# Patient Record
Sex: Female | Born: 1935 | Race: White | Hispanic: No | Smoking: Former smoker
Health system: Southern US, Community
[De-identification: ages and names within clinical notes are randomized; demographics above are authoritative.]

## PROBLEM LIST (undated history)

## (undated) DIAGNOSIS — E785 Hyperlipidemia, unspecified: Secondary | ICD-10-CM

## (undated) DIAGNOSIS — J449 Chronic obstructive pulmonary disease, unspecified: Secondary | ICD-10-CM

## (undated) DIAGNOSIS — F419 Anxiety disorder, unspecified: Secondary | ICD-10-CM

## (undated) HISTORY — PX: EYE SURGERY: SHX253

## (undated) HISTORY — DX: Hyperlipidemia, unspecified: E78.5

## (undated) HISTORY — PX: OTHER SURGICAL HISTORY: SHX169

## (undated) HISTORY — DX: Anxiety disorder, unspecified: F41.9

---

## 1998-03-24 ENCOUNTER — Other Ambulatory Visit: Admission: RE | Admit: 1998-03-24 | Discharge: 1998-03-24 | Payer: Self-pay

## 1999-08-30 ENCOUNTER — Encounter: Payer: Self-pay | Admitting: Family Medicine

## 1999-08-30 ENCOUNTER — Encounter: Admission: RE | Admit: 1999-08-30 | Discharge: 1999-08-30 | Payer: Self-pay | Admitting: Family Medicine

## 2000-10-08 ENCOUNTER — Encounter: Admission: RE | Admit: 2000-10-08 | Discharge: 2000-10-08 | Payer: Self-pay | Admitting: Family Medicine

## 2000-10-08 ENCOUNTER — Encounter: Payer: Self-pay | Admitting: Family Medicine

## 2000-10-29 ENCOUNTER — Other Ambulatory Visit: Admission: RE | Admit: 2000-10-29 | Discharge: 2000-10-29 | Payer: Self-pay | Admitting: Family Medicine

## 2001-10-24 ENCOUNTER — Encounter: Admission: RE | Admit: 2001-10-24 | Discharge: 2001-10-24 | Payer: Self-pay | Admitting: Family Medicine

## 2001-10-24 ENCOUNTER — Encounter: Payer: Self-pay | Admitting: Family Medicine

## 2002-12-09 ENCOUNTER — Other Ambulatory Visit: Admission: RE | Admit: 2002-12-09 | Discharge: 2002-12-09 | Payer: Self-pay | Admitting: Family Medicine

## 2002-12-10 ENCOUNTER — Encounter: Payer: Self-pay | Admitting: Family Medicine

## 2002-12-10 ENCOUNTER — Encounter: Admission: RE | Admit: 2002-12-10 | Discharge: 2002-12-10 | Payer: Self-pay | Admitting: Family Medicine

## 2003-12-16 ENCOUNTER — Encounter: Admission: RE | Admit: 2003-12-16 | Discharge: 2003-12-16 | Payer: Self-pay | Admitting: Family Medicine

## 2004-03-09 ENCOUNTER — Encounter: Admission: RE | Admit: 2004-03-09 | Discharge: 2004-03-09 | Payer: Self-pay | Admitting: Family Medicine

## 2004-03-28 ENCOUNTER — Ambulatory Visit (HOSPITAL_COMMUNITY): Admission: RE | Admit: 2004-03-28 | Discharge: 2004-03-28 | Payer: Self-pay | Admitting: Gastroenterology

## 2005-06-18 ENCOUNTER — Encounter: Admission: RE | Admit: 2005-06-18 | Discharge: 2005-06-18 | Payer: Self-pay | Admitting: Family Medicine

## 2006-01-15 ENCOUNTER — Ambulatory Visit (HOSPITAL_COMMUNITY): Admission: RE | Admit: 2006-01-15 | Discharge: 2006-01-15 | Payer: Self-pay | Admitting: Cardiovascular Disease

## 2006-07-08 ENCOUNTER — Encounter: Admission: RE | Admit: 2006-07-08 | Discharge: 2006-07-08 | Payer: Self-pay | Admitting: Family Medicine

## 2007-03-06 ENCOUNTER — Emergency Department: Payer: Self-pay | Admitting: Emergency Medicine

## 2007-07-13 ENCOUNTER — Emergency Department (HOSPITAL_COMMUNITY): Admission: EM | Admit: 2007-07-13 | Discharge: 2007-07-13 | Payer: Self-pay | Admitting: Emergency Medicine

## 2007-08-05 ENCOUNTER — Ambulatory Visit (HOSPITAL_COMMUNITY): Admission: RE | Admit: 2007-08-05 | Discharge: 2007-08-05 | Payer: Self-pay | Admitting: Cardiovascular Disease

## 2007-08-11 ENCOUNTER — Encounter: Admission: RE | Admit: 2007-08-11 | Discharge: 2007-08-11 | Payer: Self-pay | Admitting: Family Medicine

## 2008-05-23 ENCOUNTER — Emergency Department (HOSPITAL_COMMUNITY): Admission: EM | Admit: 2008-05-23 | Discharge: 2008-05-23 | Payer: Self-pay | Admitting: Emergency Medicine

## 2008-08-18 ENCOUNTER — Encounter: Admission: RE | Admit: 2008-08-18 | Discharge: 2008-08-18 | Payer: Self-pay | Admitting: Family Medicine

## 2009-05-29 IMAGING — CR DG CHEST 2V
2 series · 2 of 2 positions shown · non-contrast
Comparison: none

CLINICAL DATA: Epigastric pain and chest pain.
 CHEST - 2 VIEW:

[w chest pa]
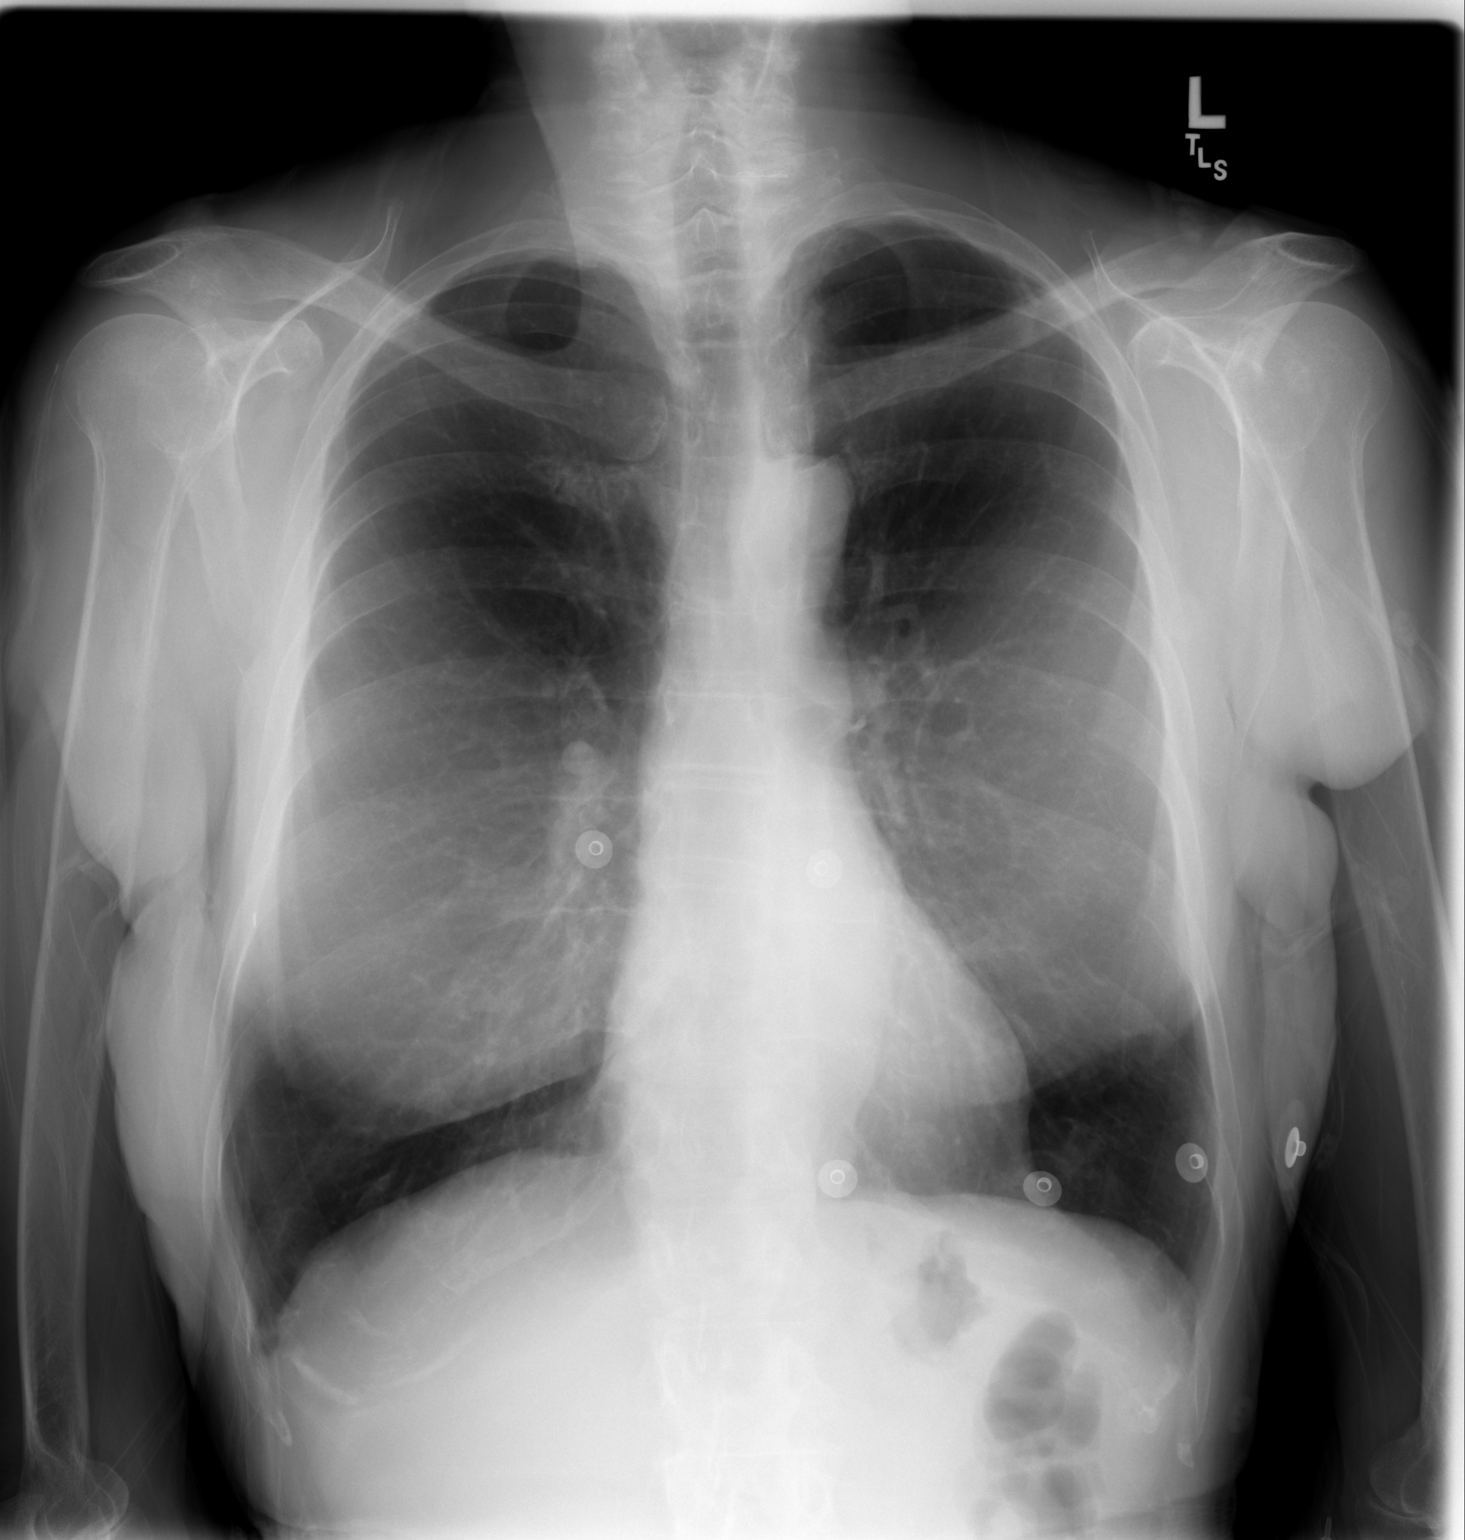

[w chest lat]
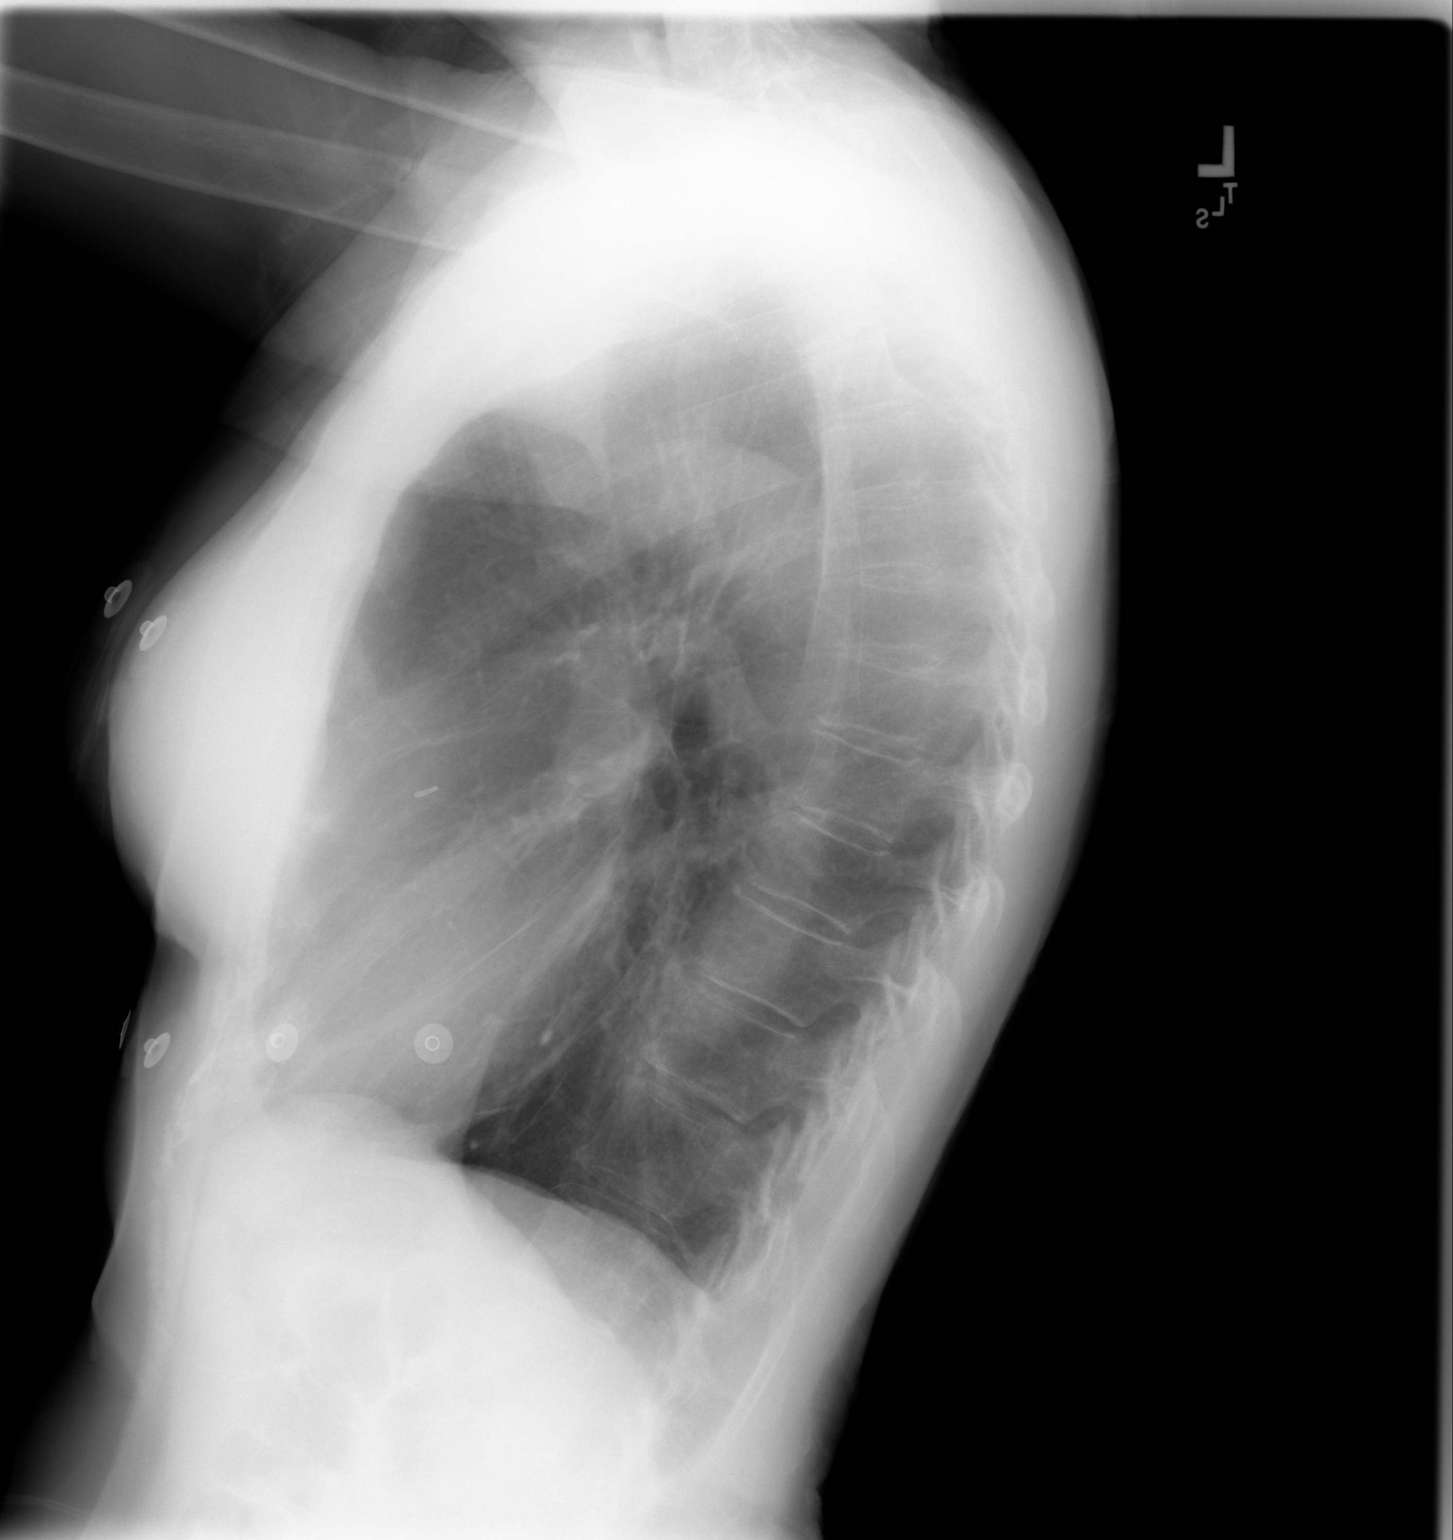

[2 of 2 positions shown; findings below may reference images not displayed]

FINDINGS: Heart size and vascularity are normal and the lungs are clear.  No significant bony abnormality.
IMPRESSION: Normal chest.

## 2009-09-21 ENCOUNTER — Encounter: Admission: RE | Admit: 2009-09-21 | Discharge: 2009-09-21 | Payer: Self-pay | Admitting: Geriatric Medicine

## 2009-09-29 ENCOUNTER — Encounter: Admission: RE | Admit: 2009-09-29 | Discharge: 2009-09-29 | Payer: Self-pay | Admitting: Geriatric Medicine

## 2009-10-10 ENCOUNTER — Other Ambulatory Visit: Admission: RE | Admit: 2009-10-10 | Discharge: 2009-10-10 | Payer: Self-pay | Admitting: Geriatric Medicine

## 2009-11-08 ENCOUNTER — Encounter: Admission: RE | Admit: 2009-11-08 | Discharge: 2009-11-08 | Payer: Self-pay | Admitting: Geriatric Medicine

## 2009-12-13 ENCOUNTER — Observation Stay (HOSPITAL_COMMUNITY): Admission: EM | Admit: 2009-12-13 | Discharge: 2009-12-15 | Payer: Self-pay | Admitting: Emergency Medicine

## 2010-10-03 ENCOUNTER — Encounter
Admission: RE | Admit: 2010-10-03 | Discharge: 2010-10-03 | Payer: Self-pay | Source: Home / Self Care | Attending: Geriatric Medicine | Admitting: Geriatric Medicine

## 2010-10-08 ENCOUNTER — Encounter: Payer: Self-pay | Admitting: Family Medicine

## 2010-10-08 ENCOUNTER — Encounter: Payer: Self-pay | Admitting: Cardiovascular Disease

## 2010-10-08 ENCOUNTER — Encounter: Payer: Self-pay | Admitting: Geriatric Medicine

## 2010-12-10 LAB — POCT I-STAT, CHEM 8
BUN: 11 mg/dL (ref 6–23)
Creatinine, Ser: 0.8 mg/dL (ref 0.4–1.2)
Potassium: 3.9 mEq/L (ref 3.5–5.1)
Sodium: 139 mEq/L (ref 135–145)

## 2010-12-10 LAB — URINALYSIS, ROUTINE W REFLEX MICROSCOPIC
Glucose, UA: NEGATIVE mg/dL
Ketones, ur: 15 mg/dL — AB
Nitrite: NEGATIVE
Specific Gravity, Urine: 1.012 (ref 1.005–1.030)
pH: 7 (ref 5.0–8.0)

## 2010-12-10 LAB — URINE MICROSCOPIC-ADD ON

## 2010-12-10 LAB — CBC
Hemoglobin: 14.3 g/dL (ref 12.0–15.0)
RBC: 4.16 MIL/uL (ref 3.87–5.11)

## 2010-12-10 LAB — URINE CULTURE

## 2010-12-10 LAB — DIFFERENTIAL
Lymphocytes Relative: 15 % (ref 12–46)
Lymphs Abs: 1.2 10*3/uL (ref 0.7–4.0)
Monocytes Absolute: 0.4 10*3/uL (ref 0.1–1.0)
Monocytes Relative: 5 % (ref 3–12)
Neutro Abs: 6.1 10*3/uL (ref 1.7–7.7)

## 2010-12-10 LAB — POCT CARDIAC MARKERS
CKMB, poc: 3 ng/mL (ref 1.0–8.0)
Myoglobin, poc: 70.3 ng/mL (ref 12–200)
Myoglobin, poc: 89.8 ng/mL (ref 12–200)

## 2011-01-11 ENCOUNTER — Other Ambulatory Visit: Payer: Self-pay | Admitting: Dermatology

## 2011-02-02 NOTE — Op Note (Signed)
NAME:  Kimberly Werner, Kimberly Werner                          ACCOUNT NO.:  [id number]   MEDICAL RECORD NO.:  [id number]                   PATIENT TYPE:  AMB   LOCATION:  ENDO                                 FACILITY:  [facility]   PHYSICIAN:  Graylin Shiver, M.D.                DATE OF BIRTH:  Oct 14, 1928   DATE OF PROCEDURE:  03/28/2004  DATE OF DISCHARGE:                                 OPERATIVE REPORT   PROCEDURE:  Colonoscopy.   INDICATIONS:  Screening.   Informed consent was obtained after explanation of the risks of bleeding,  infection, and perforation.   PREMEDICATION:  Fentanyl 50 mcg IV, Versed 5 mg IV.   PROCEDURE:  With the patient in the left lateral decubitus position, a  rectal exam was performed.  No masses were felt.  The Olympus colonoscope  was inserted into the rectum and advanced around the colon to the cecum.  Cecal landmarks were identified.  The cecum and ascending colon were normal.  The transverse colon normal.  The descending colon, sigmoid, and rectum were  normal.  She tolerated the procedure well without complications.   IMPRESSION:  Normal colonoscopy to the cecum.                                               Graylin Shiver, M.D.    Germain Osgood  D:  03/28/2004  T:  03/28/2004  Job:  [id number]   cc:   Burnell Blanks, M.D.

## 2011-05-30 ENCOUNTER — Other Ambulatory Visit: Payer: Self-pay | Admitting: Geriatric Medicine

## 2011-05-30 ENCOUNTER — Other Ambulatory Visit (HOSPITAL_COMMUNITY)
Admission: RE | Admit: 2011-05-30 | Discharge: 2011-05-30 | Disposition: A | Discharge status: Home / Self Care | Payer: PRIVATE HEALTH INSURANCE | Source: Ambulatory Visit | Attending: Geriatric Medicine | Admitting: Geriatric Medicine

## 2011-05-30 DIAGNOSIS — Z1159 Encounter for screening for other viral diseases: Secondary | ICD-10-CM | POA: Insufficient documentation

## 2011-05-30 DIAGNOSIS — Z124 Encounter for screening for malignant neoplasm of cervix: Secondary | ICD-10-CM | POA: Insufficient documentation

## 2011-06-20 LAB — DIFFERENTIAL
Basophils Absolute: 0
Basophils Relative: 1
Eosinophils Absolute: 0.1
Neutro Abs: 5
Neutrophils Relative %: 66

## 2011-06-20 LAB — COMPREHENSIVE METABOLIC PANEL
ALT: 23
Alkaline Phosphatase: 40
BUN: 9
CO2: 30
GFR calc non Af Amer: >60
Glucose, Bld: 101 — ABNORMAL HIGH
Potassium: 5
Total Bilirubin: 0.6
Total Protein: 7.1

## 2011-06-20 LAB — URINE MICROSCOPIC-ADD ON

## 2011-06-20 LAB — CBC
HCT: 42.7
Hemoglobin: 14.5
RBC: 4.4
RDW: 12.4

## 2011-06-20 LAB — URINALYSIS, ROUTINE W REFLEX MICROSCOPIC
Glucose, UA: NEGATIVE
Hgb urine dipstick: NEGATIVE
Ketones, ur: NEGATIVE
Protein, ur: NEGATIVE
pH: 7.5

## 2011-06-27 LAB — COMPREHENSIVE METABOLIC PANEL
ALT: 24
AST: 28
Albumin: 4.2
CO2: 26
Calcium: 10.4
Chloride: 103
Creatinine, Ser: 0.63
GFR calc Af Amer: >60
GFR calc non Af Amer: >60
Sodium: 135
Total Bilirubin: 1.1

## 2011-06-27 LAB — CBC
MCV: 94.4
Platelets: 324
RBC: 4.26
WBC: 6.6

## 2011-06-27 LAB — URINALYSIS, ROUTINE W REFLEX MICROSCOPIC
Nitrite: NEGATIVE
Protein, ur: NEGATIVE
Specific Gravity, Urine: 1.006
Urobilinogen, UA: 0.2

## 2011-06-27 LAB — POCT CARDIAC MARKERS
CKMB, poc: 5.8
Troponin i, poc: <0.05

## 2011-06-27 LAB — DIFFERENTIAL
Eosinophils Absolute: 0
Eosinophils Relative: 1
Lymphocytes Relative: 26
Lymphs Abs: 1.7
Monocytes Absolute: 0.4

## 2011-10-03 ENCOUNTER — Other Ambulatory Visit: Payer: Self-pay | Admitting: Geriatric Medicine

## 2013-01-01 ENCOUNTER — Institutional Professional Consult (permissible substitution): Payer: PRIVATE HEALTH INSURANCE | Admitting: Internal Medicine

## 2013-01-08 ENCOUNTER — Other Ambulatory Visit (HOSPITAL_COMMUNITY): Payer: Self-pay | Admitting: Geriatric Medicine

## 2013-01-08 ENCOUNTER — Ambulatory Visit (HOSPITAL_COMMUNITY)
Admission: RE | Admit: 2013-01-08 | Discharge: 2013-01-08 | Disposition: A | Discharge status: Home / Self Care | Payer: PRIVATE HEALTH INSURANCE | Source: Ambulatory Visit | Attending: Geriatric Medicine | Admitting: Geriatric Medicine

## 2013-01-08 DIAGNOSIS — M25569 Pain in unspecified knee: Secondary | ICD-10-CM

## 2013-01-08 DIAGNOSIS — M79609 Pain in unspecified limb: Secondary | ICD-10-CM

## 2013-01-08 NOTE — Progress Notes (Signed)
Bilateral lower extremity venous duplex:  No evidence of DVT, superficial thrombosis, or Baker's Cyst.   

## 2013-04-06 ENCOUNTER — Ambulatory Visit: Payer: PRIVATE HEALTH INSURANCE | Admitting: Cardiovascular Disease

## 2013-04-09 ENCOUNTER — Encounter: Payer: Self-pay | Admitting: Cardiovascular Disease

## 2013-04-09 ENCOUNTER — Ambulatory Visit (INDEPENDENT_AMBULATORY_CARE_PROVIDER_SITE_OTHER): Payer: PRIVATE HEALTH INSURANCE | Admitting: Cardiovascular Disease

## 2013-04-09 VITALS — BP 142/80 | HR 62 | Ht 63.5 in | Wt 115.4 lb

## 2013-04-09 DIAGNOSIS — E785 Hyperlipidemia, unspecified: Secondary | ICD-10-CM | POA: Insufficient documentation

## 2013-04-09 DIAGNOSIS — R002 Palpitations: Secondary | ICD-10-CM

## 2013-04-09 NOTE — Patient Instructions (Addendum)
Your physician recommends that you schedule a follow-up appointment in: As Needed    

## 2013-04-09 NOTE — Progress Notes (Signed)
   04/09/2013 Kimberly Werner   [birth year]  [id number]  Primary Physician Ginette Otto, MD Primary Cardiologist: Runell Gess MD Roseanne Reno   HPI:  Kimberly Werner Lean is an 79 year old divorced Caucasian female mother to come in her mother, grandmother accompanied by one of her daughters today. Apparently I had seen her remotely. Her primary care physician is Dr. Londell Moh came. Her only risk factor lutes hyperlipidemia but she statin intolerant. There is no family history of heart disease. She's never had a heart attack or stroke, denies chest pain or shortness of breath. +2-3 weeks ago she had a "hot feeling" in the morning after waking up accompanied by heart pounding for 10 minutes nausea and vomiting. This never happened prior to or since the index event.   Current Outpatient Prescriptions  Medication Sig Dispense Refill  . ALPRAZolam (XANAX) 0.5 MG tablet Take 0.25 mg by mouth at bedtime as needed.      . Cholecalciferol (VITAMIN D3) 1000 UNITS CAPS Take 1 capsule by mouth daily.      . cyclobenzaprine (FLEXERIL) 10 MG tablet Take 10 mg by mouth as needed for muscle spasms.      . Ibuprofen (ADVIL PO) Take 1 tablet by mouth as needed.      . loratadine (CLARITIN) 10 MG tablet Take 10 mg by mouth daily.      . Multiple Vitamin (MULTIVITAMIN WITH MINERALS) TABS Take 1 tablet by mouth daily.      . pantoprazole (PROTONIX) 40 MG tablet Take 1 tablet by mouth daily.       No current facility-administered medications for this visit.    Not on File  History   Social History  . Marital Status: Divorced    Spouse Name: N/A    Number of Children: N/A  . Years of Education: N/A   Occupational History  . Not on file.   Social History Main Topics  . Smoking status: Former Smoker    Quit date: 04/09/1953  . Smokeless tobacco: Not on file  . Alcohol Use: No  . Drug Use: No  . Sexually Active: Not on file   Other Topics Concern  . Not on file   Social History  Narrative  . No narrative on file     Review of Systems: General: negative for chills, fever, night sweats or weight changes.  Cardiovascular: negative for chest pain, dyspnea on exertion, edema, orthopnea, palpitations, paroxysmal nocturnal dyspnea or shortness of breath Dermatological: negative for rash Respiratory: negative for cough or wheezing Urologic: negative for hematuria Abdominal: negative for nausea, vomiting, diarrhea, bright red blood per rectum, melena, or hematemesis Neurologic: negative for visual changes, syncope, or dizziness All other systems reviewed and are otherwise negative except as noted above.    Blood pressure 142/80, pulse 62, height 5' 3.5" (1.613 m), weight 115 lb 6.4 oz (52.345 kg).  General appearance: alert and no distress Neck: no adenopathy, no carotid bruit, no JVD, supple, symmetrical, trachea midline and thyroid not enlarged, symmetric, no tenderness/mass/nodules Lungs: clear to auscultation bilaterally Heart: regular rate and rhythm, S1, S2 normal, no murmur, click, rub or gallop Extremities: extremities normal, atraumatic, no cyanosis or edema  EKG normal right sinus rhythm at 62 without ST or T wave changes  ASSESSMENT AND PLAN:   Palpitations patient had a short episode of heart pounding/palpitations several weeks ago which has not recurred since       Runell Gess MD [facility] 04/09/2013 2:39 PM

## 2013-04-09 NOTE — Assessment & Plan Note (Signed)
patient had a short episode of heart pounding/palpitations several weeks ago which has not recurred since

## 2013-09-19 ENCOUNTER — Encounter (HOSPITAL_COMMUNITY): Payer: Self-pay | Admitting: Emergency Medicine

## 2013-09-30 ENCOUNTER — Encounter: Payer: Self-pay | Admitting: Neurology

## 2013-09-30 NOTE — Telephone Encounter (Signed)
Kimberly Werner states she needs inpatient rehab and discharge summary order for patient, please contact her as soon as possible.

## 2013-09-30 NOTE — Telephone Encounter (Signed)
This encounter was created in error - please disregard.

## 2013-10-05 ENCOUNTER — Ambulatory Visit: Payer: Self-pay | Admitting: Podiatry

## 2013-10-12 ENCOUNTER — Ambulatory Visit (INDEPENDENT_AMBULATORY_CARE_PROVIDER_SITE_OTHER): Payer: Medicare Other | Admitting: Podiatry

## 2013-10-12 ENCOUNTER — Encounter: Payer: Self-pay | Admitting: Podiatry

## 2013-10-12 ENCOUNTER — Ambulatory Visit (INDEPENDENT_AMBULATORY_CARE_PROVIDER_SITE_OTHER): Payer: Medicare Other

## 2013-10-12 VITALS — BP 124/85 | HR 67 | Resp 16 | Ht 65.0 in | Wt 111.0 lb

## 2013-10-12 DIAGNOSIS — M2010 Hallux valgus (acquired), unspecified foot: Secondary | ICD-10-CM

## 2013-10-12 DIAGNOSIS — Q828 Other specified congenital malformations of skin: Secondary | ICD-10-CM

## 2013-10-12 DIAGNOSIS — M79672 Pain in left foot: Secondary | ICD-10-CM

## 2013-10-12 DIAGNOSIS — M79609 Pain in unspecified limb: Secondary | ICD-10-CM

## 2013-10-12 NOTE — Patient Instructions (Signed)

## 2013-10-12 NOTE — Progress Notes (Signed)
   Subjective:    Patient ID: Kimberly Werner, female    DOB: 05/29/1929, 80 y.o.   MRN: [id number]  HPI Comments: This left foot is very sore, very hard to walk. Left foot bunion, ongoing for a while, got real bad the last few months, 1st mpj painful and the tip of the left big toe painful.   Foot Pain      Review of Systems  All other systems reviewed and are negative.       Objective:   Physical Exam: I have reviewed her past medical history medications allergies surgeries and social history. Vital signs are stable she is alert and oriented x3. Pulses are palpable bilateral. Neurologic sensorium is intact bilateral. Deep tendon reflexes are elicitable. Muscle strength is 5 over 5 dorsiflexors plantar flexors inverters everters all intrinsic musculature appears to be intact. Orthopedic evaluation demonstrates rectus right foot with some overlapping hammertoe deformity. Left foot demonstrates severe hallux abductovalgus deformity with hammertoe deformity 2 through 5. She has pain on palpation sub-first metatarsophalangeal joint. Cutaneous evaluation Mr. is supple well hydrated cutis with a reactive hyperkeratosis or porokeratotic lesion to the plantar aspect of the first metatarsophalangeal joint area of the left foot. Radiographic evaluation demonstrates severe osteopenia and severe dislocated first metatarsophalangeal joint with a valgus rotation of the hallux.        Assessment & Plan:  Assessment: Severe hallux abductovalgus deformity left with a reactive porokeratotic lesion sub-first left. Fat-pad atrophy left foot.  Plan: Discussed etiology pathology conservative versus surgical therapies. I debrided the porokeratotic lesion for her today and placed a silicone pad to the hallux as well as a silicone metatarsal pad. I will followup with her as needed

## 2013-10-21 ENCOUNTER — Ambulatory Visit
Admission: RE | Admit: 2013-10-21 | Discharge: 2013-10-21 | Disposition: A | Discharge status: Home / Self Care | Payer: PRIVATE HEALTH INSURANCE | Source: Ambulatory Visit | Attending: Gastroenterology | Admitting: Gastroenterology

## 2013-10-21 ENCOUNTER — Other Ambulatory Visit: Payer: Self-pay | Admitting: Gastroenterology

## 2013-10-21 DIAGNOSIS — R1310 Dysphagia, unspecified: Secondary | ICD-10-CM

## 2014-02-12 ENCOUNTER — Encounter (HOSPITAL_COMMUNITY): Payer: Self-pay | Admitting: Emergency Medicine

## 2014-02-12 ENCOUNTER — Emergency Department (HOSPITAL_COMMUNITY)
Admission: EM | Admit: 2014-02-12 | Discharge: 2014-02-12 | Disposition: A | Discharge status: Home / Self Care | Payer: PRIVATE HEALTH INSURANCE | Attending: Emergency Medicine | Admitting: Emergency Medicine

## 2014-02-12 ENCOUNTER — Emergency Department (HOSPITAL_COMMUNITY): Payer: PRIVATE HEALTH INSURANCE

## 2014-02-12 DIAGNOSIS — J4489 Other specified chronic obstructive pulmonary disease: Secondary | ICD-10-CM | POA: Insufficient documentation

## 2014-02-12 DIAGNOSIS — Y929 Unspecified place or not applicable: Secondary | ICD-10-CM | POA: Insufficient documentation

## 2014-02-12 DIAGNOSIS — Y93K1 Activity, walking an animal: Secondary | ICD-10-CM | POA: Insufficient documentation

## 2014-02-12 DIAGNOSIS — Z862 Personal history of diseases of the blood and blood-forming organs and certain disorders involving the immune mechanism: Secondary | ICD-10-CM | POA: Insufficient documentation

## 2014-02-12 DIAGNOSIS — Z87891 Personal history of nicotine dependence: Secondary | ICD-10-CM | POA: Insufficient documentation

## 2014-02-12 DIAGNOSIS — F411 Generalized anxiety disorder: Secondary | ICD-10-CM | POA: Insufficient documentation

## 2014-02-12 DIAGNOSIS — J449 Chronic obstructive pulmonary disease, unspecified: Secondary | ICD-10-CM | POA: Insufficient documentation

## 2014-02-12 DIAGNOSIS — S42293A Other displaced fracture of upper end of unspecified humerus, initial encounter for closed fracture: Secondary | ICD-10-CM | POA: Insufficient documentation

## 2014-02-12 DIAGNOSIS — S4292XA Fracture of left shoulder girdle, part unspecified, initial encounter for closed fracture: Secondary | ICD-10-CM

## 2014-02-12 DIAGNOSIS — W1809XA Striking against other object with subsequent fall, initial encounter: Secondary | ICD-10-CM | POA: Insufficient documentation

## 2014-02-12 DIAGNOSIS — Z79899 Other long term (current) drug therapy: Secondary | ICD-10-CM | POA: Insufficient documentation

## 2014-02-12 DIAGNOSIS — Z8639 Personal history of other endocrine, nutritional and metabolic disease: Secondary | ICD-10-CM | POA: Insufficient documentation

## 2014-02-12 HISTORY — DX: Chronic obstructive pulmonary disease, unspecified: J44.9

## 2014-02-12 MED ORDER — OXYCODONE-ACETAMINOPHEN 5-325 MG PO TABS
1.0000 | ORAL_TABLET | Freq: Once | ORAL | Status: AC
  Administered 2014-02-12: 1 via ORAL
  Filled 2014-02-12: qty 1

## 2014-02-12 MED ORDER — HYDROCODONE-ACETAMINOPHEN 7.5-325 MG/15ML PO SOLN: 10.0000 mL | Freq: Four times a day (QID) | ORAL | Status: DC | PRN

## 2014-02-12 MED ORDER — MORPHINE SULFATE 4 MG/ML IJ SOLN
4.0000 mg | Freq: Once | INTRAMUSCULAR | Status: AC
  Administered 2014-02-12: 4 mg via INTRAVENOUS
  Filled 2014-02-12: qty 1

## 2014-02-12 MED ORDER — MORPHINE SULFATE 2 MG/ML IJ SOLN
2.0000 mg | INTRAMUSCULAR | Status: AC
  Administered 2014-02-12: 2 mg via INTRAVENOUS
  Filled 2014-02-12: qty 1

## 2014-02-12 MED ORDER — MORPHINE SULFATE 2 MG/ML IJ SOLN
2.0000 mg | Freq: Once | INTRAMUSCULAR | Status: AC
  Administered 2014-02-12: 2 mg via INTRAVENOUS
  Filled 2014-02-12: qty 1

## 2014-02-12 NOTE — ED Notes (Signed)
Pt brought to ED from [facility] by [facility] for c/o chest pain, dizziness and diaphoresis. 3 nitroglycerine SL, 324 mg ASA PO, Maalox and cough syrup given on SNF, by the time EMS got to SNF pt was pain free with WNL VS. BP 104/68, HR 78, O2 sat 98% on RA. GBG 182.

## 2014-02-12 NOTE — Discharge Instructions (Signed)
Humerus Fracture, Treated with Immobilization °The humerus is the large bone in your upper arm. You have a broken (fractured) humerus. These fractures are easily diagnosed with X-rays. °TREATMENT  °Simple fractures which will heal without disability are treated with simple immobilization. Immobilization means you will wear a cast, splint, or sling. You have a fracture which will do well with immobilization. The fracture will heal well simply by being held in a good position until it is stable enough to begin range of motion exercises. Do not take part in activities which would further injure your arm.  °HOME CARE INSTRUCTIONS  °· Put ice on the injured area. °· Put ice in a plastic bag. °· Place a towel between your skin and the bag. °· Leave the ice on for 15-20 minutes, 03-04 times a day. °· If you have a cast: °· Do not scratch the skin under the cast using sharp or pointed objects. °· Check the skin around the cast every day. You may put lotion on any red or sore areas. °· Keep your cast dry and clean. °· If you have a splint: °· Wear the splint as directed. °· Keep your splint dry and clean. °· You may loosen the elastic around the splint if your fingers become numb, tingle, or turn cold or blue. °· If you have a sling: °· Wear the sling as directed. °· Do not put pressure on any part of your cast or splint until it is fully hardened. °· Your cast or splint can be protected during bathing with a plastic bag. Do not lower the cast or splint into water. °· Only take over-the-counter or prescription medicines for pain, discomfort, or fever as directed by your caregiver. °· Do range of motion exercises as instructed by your caregiver. °· Follow up as directed by your caregiver. This is very important in order to avoid permanent injury or disability and chronic pain. °SEEK IMMEDIATE MEDICAL CARE IF:  °· Your skin or nails in the injured arm turn blue or gray. °· Your arm feels cold or numb. °· You develop severe  pain in the injured arm. °· You are having problems with the medicines you were given. °MAKE SURE YOU:  °· Understand these instructions. °· Will watch your condition. °· Will get help right away if you are not doing well or get worse. °Document Released: 12/10/2000 Document Revised: 11/26/2011 Document Reviewed: 10/18/2010 °Exit[employer]® Patient Information ©2014 [employer]. ° °

## 2014-02-12 NOTE — Consult Note (Signed)
Reason for Consult:  Left shoulder pain Referring Physician: Dr. Terance Ice Kimberly Werner is an 80 y.o. female.  HPI:  80 y/o female without significant pmh was walking her dog today when the dog pulled her down.  She fell on her left shoulder.  She denies any LOC.  She denies any pain elsewhere in her body.  She lives alone.  Two daughters are at her bedside.  She s/o aching dull pain in the left shoulder that is moderate when holding still and becomes severe with movement.  She denies numbness, tingling or weakness in the left upper extremity.  Past Medical History  Diagnosis Date  . Hyperlipidemia   . Anxiety   . COPD (chronic obstructive pulmonary disease)     Past Surgical History  Procedure Laterality Date  . Eyes surgery  49yrs ago  . Masectomy  3yrs    double  . Eye surgery      Family History  Problem Relation Age of Onset  . COPD Father   . Cancer Mother     breast    Social History:  reports that she quit smoking about 60 years ago. She does not have any smokeless tobacco history on file. She reports that she does not drink alcohol or use illicit drugs.  Allergies: No Known Allergies  Medications: I have reviewed the patient's current medications.  No results found for this or any previous visit (from the past 48 hour(s)).  Dg Shoulder Left  02/12/2014   CLINICAL DATA:  Fall.  EXAM: LEFT SHOULDER - 2+ VIEW  COMPARISON:  Chest x-ray 02/06/2014 .  FINDINGS: There is a comminuted fracture of the subcapital region of the left humerus. Fracture fragments are slightly displaced. Degenerative changes left shoulder. Diffuse osteopenia.  IMPRESSION: Comminuted subcapital left humeral fracture .   Electronically Signed   By: Maisie Fus  Register   On: 02/12/2014 13:30    ROS  No recent f/c/n/v/wt loss Blood pressure 132/78, pulse 63, temperature 98 F (36.7 C), temperature source Oral, resp. rate 22, height 5\' 5"  (1.651 m), weight 50.803 kg (112 lb), SpO2 96.00%. Physical  Exam Thin elderly woman in nad.  HOH.  Alert and oriented x 4.  Mood and affect normal.  EOMI.  Resp unlabored.  L shoulder with swelling and ecchymosis.  Skin intact.  Sens to LT ant motor function 5/5 in radial, median and ulnar nerve dist.  2+ radial pulse.  No lymphadenoapthy of the L UE.  Assessment/Plan: L proximal humerus fracture - I explained the nature of the injury to the patient and her daughters in detail.  At this point the fracture is not significantly displaced, although it is quite comminuted.  We'll immobilize her in a sling.  I've taught her finger, hand, wrist and elbow ROM exercises.  I'll have her follow up in the office in 1-2 weeks to see Dr. Ranell Patrick or Supple for further management of this injury.  She and her daughters understand this plan and agree.  Toni Arthurs 02/12/2014, 8:08 PM

## 2014-02-12 NOTE — ED Provider Notes (Signed)
CSN: [id number]     Arrival date & time 02/12/14  1224 History   First MD Initiated Contact with Patient 02/12/14 1227     Chief Complaint  Patient presents with  . Fall     (Consider location/radiation/quality/duration/timing/severity/associated sxs/prior Treatment) HPI 80 year old female who was walking her dog when her dog pulled on the leash and she fell to the ground striking her left shoulder. She denies hitting her head or any other injury. Due to the pain she was unable to get up. A neighbor rapidly found her and called EMS. She was transported here via EMS and received fentanyl 100 mics and route. She denies any anticoagulation, head injury, or other injury. She is complaining of pain left shoulder. She denies any numbness or tingling or weakness distal to the injury Past Medical History  Diagnosis Date  . Hyperlipidemia   . Anxiety   . COPD (chronic obstructive pulmonary disease)    Past Surgical History  Procedure Laterality Date  . Eyes surgery  37yrs ago  . Masectomy  53yrs    double  . Eye surgery     Family History  Problem Relation Age of Onset  . COPD Father   . Cancer Mother     breast   History  Substance Use Topics  . Smoking status: Former Smoker    Quit date: 04/09/1953  . Smokeless tobacco: Not on file  . Alcohol Use: No   OB History   Grav Para Term Preterm Abortions TAB SAB Ect Mult Living                 Review of Systems  All other systems reviewed and are negative.     Allergies  Review of patient's allergies indicates no known allergies.  Home Medications   Prior to Admission medications   Medication Sig Start Date End Date Taking? Authorizing Provider  albuterol (PROVENTIL HFA;VENTOLIN HFA) 108 (90 BASE) MCG/ACT inhaler Inhale 1-2 puffs into the lungs every 6 (six) hours as needed for wheezing or shortness of breath.   Yes Historical Provider, MD  ALPRAZolam Prudy Feeler) 0.5 MG tablet Take 0.25 mg by mouth at bedtime as needed for  sleep.  03/04/13  Yes Historical Provider, MD  Cholecalciferol (VITAMIN D3) 1000 UNITS CAPS Take 1,000 Units by mouth daily.    Yes Historical Provider, MD  DULoxetine (CYMBALTA) 60 MG capsule Take 60 mg by mouth daily.   Yes Historical Provider, MD  fluticasone (FLONASE) 50 MCG/ACT nasal spray Place 1 spray into both nostrils daily. 01/28/14  Yes Historical Provider, MD  Ibuprofen (ADVIL PO) Take 1 tablet by mouth daily as needed (pain).    Yes Historical Provider, MD  loratadine (CLARITIN) 10 MG tablet Take 10 mg by mouth daily.   Yes Historical Provider, MD  Multiple Vitamin (MULTIVITAMIN WITH MINERALS) TABS Take 1 tablet by mouth daily.   Yes Historical Provider, MD  pantoprazole (PROTONIX) 40 MG tablet Take 40 mg by mouth daily.  03/13/13  Yes Historical Provider, MD  sucralfate (CARAFATE) 1 G tablet Take 1 g by mouth daily. 01/19/14  Yes Historical Provider, MD   BP 166/82  Pulse 68  Temp(Src) 98 F (36.7 C) (Oral)  Resp 20  Ht 5\' 5"  (1.651 m)  Wt 112 lb (50.803 kg)  BMI 18.64 kg/m2  SpO2 97% Physical Exam  Nursing note and vitals reviewed. Constitutional: She is oriented to person, place, and time. She appears well-developed and well-nourished.  HENT:  Head: Normocephalic and atraumatic.  Right Ear: External ear normal.  Left Ear: External ear normal.  Nose: Nose normal.  Mouth/Throat: Oropharynx is clear and moist.  Eyes: Conjunctivae and EOM are normal. Pupils are equal, round, and reactive to light.  Neck: Normal range of motion. Neck supple. No tracheal deviation present.  No posterior cervical spine tenderness to palpation  Cardiovascular: Normal rate, regular rhythm, normal heart sounds and intact distal pulses.   Pulmonary/Chest: Effort normal and breath sounds normal.  Abdominal: Soft. Bowel sounds are normal.  Musculoskeletal:       Left shoulder: She exhibits decreased range of motion, tenderness, bony tenderness, swelling, deformity and pain. She exhibits no effusion,  no crepitus, no laceration, no spasm, normal pulse and normal strength.       Arms: No signs of trauma over pelvis, hips, knees, or ankles. Full active range of motion of bilateral hips and knees.  Neurological: She is oriented to person, place, and time. She displays normal reflexes. No cranial nerve deficit. Coordination normal.  Skin: Skin is warm and dry.  Psychiatric: She has a normal mood and affect. Her behavior is normal. Judgment and thought content normal.    ED Course  Procedures (including critical care time) Labs Review Labs Reviewed - No data to display  Imaging Review No results found.   EKG Interpretation None      MDM   Final diagnoses:  Shoulder fracture, left    IV in place received morphine 2 mg IV for pain. We are awaiting radiograph  Discussed with DR. Hewett and requested ct of shoulder and will see here to discuss with patient and family.  Patient signed out to Dr. Manus Gunningancour pending Dr. Earlie CountsHewett seeing and evaluating patient    Hilario Quarryanielle S Keeven Matty, MD 02/15/14 [phone]

## 2014-02-12 NOTE — ED Notes (Signed)
EMS - Pt was walking her dog when the dog "went crazy" and she lost her footing when she fell down on her left shoulder.  Pt left shoulder has visible deformity with bruising.  Pt has strong pulses in the left arm.  Pt given of Fentanyl then of Fentanyl.  Pt fall was witnessed with no LOC.

## 2014-02-16 ENCOUNTER — Emergency Department (HOSPITAL_COMMUNITY)
Admission: EM | Admit: 2014-02-16 | Discharge: 2014-02-16 | Disposition: A | Discharge status: Home / Self Care | Payer: Medicare Other | Attending: Emergency Medicine | Admitting: Emergency Medicine

## 2014-02-16 ENCOUNTER — Encounter (HOSPITAL_COMMUNITY): Payer: Self-pay | Admitting: Emergency Medicine

## 2014-02-16 ENCOUNTER — Emergency Department (HOSPITAL_COMMUNITY): Payer: Medicare Other

## 2014-02-16 DIAGNOSIS — S5290XD Unspecified fracture of unspecified forearm, subsequent encounter for closed fracture with routine healing: Secondary | ICD-10-CM | POA: Insufficient documentation

## 2014-02-16 DIAGNOSIS — S42209A Unspecified fracture of upper end of unspecified humerus, initial encounter for closed fracture: Secondary | ICD-10-CM

## 2014-02-16 DIAGNOSIS — Z79899 Other long term (current) drug therapy: Secondary | ICD-10-CM | POA: Insufficient documentation

## 2014-02-16 DIAGNOSIS — F411 Generalized anxiety disorder: Secondary | ICD-10-CM | POA: Insufficient documentation

## 2014-02-16 DIAGNOSIS — J449 Chronic obstructive pulmonary disease, unspecified: Secondary | ICD-10-CM | POA: Insufficient documentation

## 2014-02-16 DIAGNOSIS — E785 Hyperlipidemia, unspecified: Secondary | ICD-10-CM | POA: Insufficient documentation

## 2014-02-16 DIAGNOSIS — J4489 Other specified chronic obstructive pulmonary disease: Secondary | ICD-10-CM | POA: Insufficient documentation

## 2014-02-16 DIAGNOSIS — Z87891 Personal history of nicotine dependence: Secondary | ICD-10-CM | POA: Insufficient documentation

## 2014-02-16 MED ORDER — OXYCODONE-ACETAMINOPHEN 5-325 MG PO TABS
2.0000 | ORAL_TABLET | Freq: Once | ORAL | Status: AC
  Administered 2014-02-16: 2 via ORAL
  Filled 2014-02-16: qty 2

## 2014-02-16 NOTE — ED Notes (Signed)
Patient transported to X-ray 

## 2014-02-16 NOTE — ED Notes (Addendum)
EMS - Coming from home with c/o of significant swelling in the left hand from an injury sustained on Friday.  Given 4mg  Zofran and Fentanyl with EMS.

## 2014-02-16 NOTE — ED Notes (Signed)
Patient is alert and orientedx4.  Patient was explained discharge instructions and they understood them with no questions.  The patient's daughter, Hedy Jacob, is taking the patient home.

## 2014-02-16 NOTE — ED Provider Notes (Signed)
Patient presented to the ER with left arm pain and swelling. Patient fell approx a week ago and was diagnosed with prox humerus fracture. She has noticed increased swelling and bruising around the area of the hand and wrist. She does not have any significant pain in the area, however. Family is concerned that there might be an injury. She did not have x-rays of the hand or wrist with the original fall. Face to face Exam: HEENT - PERRLA Lungs - CTAB Heart - RRR, no M/R/G Abd - S/NT/ND Neuro - alert, oriented x3 Musculoskeletal - tenderness proximal left humerus with decreased range of motion. Mild edema of the entire arm to the dorsal aspect of hand. Slight ecchymosis over dorsal aspect of hand. No point tenderness. Patient has normal radial pulse. She has normal sensation and range of motion. No concern for compartment syndrome.  Plan: Patient with normal swelling which has progressed to the hand secondary to gravity after fracture of the proximal humerus. She is under the care of Doctor White Heath. X-ray of wrist and hand obtained, refer back to Doctor Victorino Dike.   Gilda Crease, MD 02/16/14 [phone]

## 2014-02-16 NOTE — ED Provider Notes (Signed)
CSN: [id number]     Arrival date & time 02/16/14  1322 History   First MD Initiated Contact with Patient 02/16/14 1331     Chief Complaint  Patient presents with  . Arm Swelling     (Consider location/radiation/quality/duration/timing/severity/associated sxs/prior Treatment) HPI Patient presents to the emergency department with left arm swelling following a fall that occurred last week.  The patient was seen here in the emergency department with a comminuted humeral neck fracture.  She did have surgery this Friday.  The patient's family noted that she had increased bruising and swelling to her arm.  They have not seen her arm until earlier today outside of the sling.  The patient denies chest pain, shortness of breath, numbness, weakness, dizziness, or syncope.  The patient has been taking her prescribed medications Past Medical History  Diagnosis Date  . Hyperlipidemia   . Anxiety   . COPD (chronic obstructive pulmonary disease)    Past Surgical History  Procedure Laterality Date  . Eyes surgery  2250yrs ago  . Masectomy  4050yrs    double  . Eye surgery     Family History  Problem Relation Age of Onset  . COPD Father   . Cancer Mother     breast   History  Substance Use Topics  . Smoking status: Former Smoker    Quit date: 04/09/1953  . Smokeless tobacco: Not on file  . Alcohol Use: No   OB History   Grav Para Term Preterm Abortions TAB SAB Ect Mult Living                 Review of Systems All other systems negative except as documented in the HPI. All pertinent positives and negatives as reviewed in the HPI.   Allergies  Review of patient's allergies indicates no known allergies.  Home Medications   Prior to Admission medications   Medication Sig Start Date End Date Taking? Authorizing Provider  ALPRAZolam Prudy Feeler(XANAX) 0.5 MG tablet Take 0.25 mg by mouth 2 (two) times daily as needed for anxiety or sleep.  03/04/13  Yes Historical Provider, MD  Cholecalciferol (VITAMIN  D3) 1000 UNITS CAPS Take 1,000 Units by mouth daily.    Yes Historical Provider, MD  DULoxetine (CYMBALTA) 60 MG capsule Take 60 mg by mouth daily.   Yes Historical Provider, MD  HYDROcodone-acetaminophen (HYCET) 7.5-325 mg/15 ml solution Take 10 mLs by mouth 4 (four) times daily as needed for moderate pain. 02/12/14 02/12/15 Yes Glynn OctaveStephen Rancour, MD  Ibuprofen (ADVIL PO) Take 1 tablet by mouth daily as needed (pain).    Yes Historical Provider, MD  loratadine (CLARITIN) 10 MG tablet Take 10 mg by mouth daily.   Yes Historical Provider, MD  Multiple Vitamin (MULTIVITAMIN WITH MINERALS) TABS Take 1 tablet by mouth daily.   Yes Historical Provider, MD  Oxycodone HCl 10 MG TABS Take 10 mg by mouth every 4 (four) hours as needed. pain 02/15/14  Yes Historical Provider, MD  pantoprazole (PROTONIX) 40 MG tablet Take 40 mg by mouth daily.  03/13/13  Yes Historical Provider, MD  sucralfate (CARAFATE) 1 G tablet Take 1 g by mouth daily. 01/19/14  Yes Historical Provider, MD   BP 122/70  Pulse 88  Temp(Src) 99.9 F (37.7 C) (Oral)  Resp 16  SpO2 93% Physical Exam  Nursing note and vitals reviewed. Constitutional: She is oriented to person, place, and time. She appears well-developed and well-nourished. No distress.  HENT:  Head: Normocephalic and atraumatic.  Musculoskeletal:  Patient has swelling noted to her lower arm and hand, bruising  Neurological: She is alert and oriented to person, place, and time.  Skin: Skin is warm and dry. No rash noted. No erythema.    ED Course  Procedures (including critical care time) Labs Review Labs Reviewed - No data to display  Imaging Review No results found.  Patient will be followed up by Dr Tobi Bastos, PA-C 02/16/14 1606

## 2014-02-16 NOTE — Discharge Instructions (Signed)
If he developed increased pain in the forearm and hand, numbness and tingling of the hand or fingers, fingers turning blue or purple, return to the ER.  Humerus Fracture, Treated with Immobilization The humerus is the large bone in your upper arm. You have a broken (fractured) humerus. These fractures are easily diagnosed with X-rays. TREATMENT  Simple fractures which will heal without disability are treated with simple immobilization. Immobilization means you will wear a cast, splint, or sling. You have a fracture which will do well with immobilization. The fracture will heal well simply by being held in a good position until it is stable enough to begin range of motion exercises. Do not take part in activities which would further injure your arm.  HOME CARE INSTRUCTIONS   Put ice on the injured area.  Put ice in a plastic bag.  Place a towel between your skin and the bag.  Leave the ice on for 15-20 minutes, 03-04 times a day.  If you have a cast:  Do not scratch the skin under the cast using sharp or pointed objects.  Check the skin around the cast every day. You may put lotion on any red or sore areas.  Keep your cast dry and clean.  If you have a splint:  Wear the splint as directed.  Keep your splint dry and clean.  You may loosen the elastic around the splint if your fingers become numb, tingle, or turn cold or blue.  If you have a sling:  Wear the sling as directed.  Do not put pressure on any part of your cast or splint until it is fully hardened.  Your cast or splint can be protected during bathing with a plastic bag. Do not lower the cast or splint into water.  Only take over-the-counter or prescription medicines for pain, discomfort, or fever as directed by your caregiver.  Do range of motion exercises as instructed by your caregiver.  Follow up as directed by your caregiver. This is very important in order to avoid permanent injury or disability and chronic  pain. SEEK IMMEDIATE MEDICAL CARE IF:   Your skin or nails in the injured arm turn blue or gray.  Your arm feels cold or numb.  You develop severe pain in the injured arm.  You are having problems with the medicines you were given. MAKE SURE YOU:   Understand these instructions.  Will watch your condition.  Will get help right away if you are not doing well or get worse. Document Released: 12/10/2000 Document Revised: 11/26/2011 Document Reviewed: 10/18/2010 [facility] Patient Information 2014 [place], [place].

## 2014-02-26 NOTE — ED Provider Notes (Signed)
Medical screening examination/treatment/procedure(s) were conducted as a shared visit with non-physician practitioner(s) and myself.  I personally evaluated the patient during the encounter.  Please see separate associated note for evaluation and plan.    EKG Interpretation None        Gilda Creasehristopher J. Stokely Jeancharles, MD 02/26/14 1715

## 2014-09-22 ENCOUNTER — Emergency Department (HOSPITAL_COMMUNITY): Payer: Medicare Other

## 2014-09-22 ENCOUNTER — Encounter (HOSPITAL_COMMUNITY): Payer: Self-pay | Admitting: Family Medicine

## 2014-09-22 ENCOUNTER — Observation Stay (HOSPITAL_COMMUNITY)
Admission: EM | Admit: 2014-09-22 | Discharge: 2014-09-23 | Disposition: A | Discharge status: Home / Self Care | Payer: Medicare Other | Attending: Internal Medicine | Admitting: Internal Medicine

## 2014-09-22 DIAGNOSIS — E785 Hyperlipidemia, unspecified: Secondary | ICD-10-CM | POA: Diagnosis not present

## 2014-09-22 DIAGNOSIS — J449 Chronic obstructive pulmonary disease, unspecified: Secondary | ICD-10-CM | POA: Diagnosis not present

## 2014-09-22 DIAGNOSIS — R0789 Other chest pain: Secondary | ICD-10-CM

## 2014-09-22 DIAGNOSIS — Z79899 Other long term (current) drug therapy: Secondary | ICD-10-CM | POA: Insufficient documentation

## 2014-09-22 DIAGNOSIS — M25512 Pain in left shoulder: Secondary | ICD-10-CM | POA: Insufficient documentation

## 2014-09-22 DIAGNOSIS — F419 Anxiety disorder, unspecified: Secondary | ICD-10-CM | POA: Insufficient documentation

## 2014-09-22 DIAGNOSIS — Z87891 Personal history of nicotine dependence: Secondary | ICD-10-CM | POA: Insufficient documentation

## 2014-09-22 DIAGNOSIS — R479 Unspecified speech disturbances: Secondary | ICD-10-CM | POA: Insufficient documentation

## 2014-09-22 DIAGNOSIS — R002 Palpitations: Principal | ICD-10-CM | POA: Insufficient documentation

## 2014-09-22 DIAGNOSIS — R471 Dysarthria and anarthria: Secondary | ICD-10-CM | POA: Insufficient documentation

## 2014-09-22 DIAGNOSIS — R079 Chest pain, unspecified: Secondary | ICD-10-CM

## 2014-09-22 DIAGNOSIS — E440 Moderate protein-calorie malnutrition: Secondary | ICD-10-CM | POA: Insufficient documentation

## 2014-09-22 LAB — BASIC METABOLIC PANEL
Anion gap: 4 — ABNORMAL LOW (ref 5–15)
BUN: 11 mg/dL (ref 6–23)
CHLORIDE: 106 meq/L (ref 96–112)
CO2: 29 mmol/L (ref 19–32)
Calcium: 9.9 mg/dL (ref 8.4–10.5)
Creatinine, Ser: 0.7 mg/dL (ref 0.50–1.10)
GFR, EST NON AFRICAN AMERICAN: 78 mL/min — AB (ref >90)
Glucose, Bld: 106 mg/dL — ABNORMAL HIGH (ref 70–99)
Potassium: 4 mmol/L (ref 3.5–5.1)
Sodium: 139 mmol/L (ref 135–145)

## 2014-09-22 LAB — URINALYSIS, ROUTINE W REFLEX MICROSCOPIC
BILIRUBIN URINE: NEGATIVE
GLUCOSE, UA: NEGATIVE mg/dL
Hgb urine dipstick: NEGATIVE
KETONES UR: NEGATIVE mg/dL
NITRITE: NEGATIVE
Protein, ur: NEGATIVE mg/dL
SPECIFIC GRAVITY, URINE: 1.012 (ref 1.005–1.030)
Urobilinogen, UA: 0.2 mg/dL (ref 0.0–1.0)
pH: 7 (ref 5.0–8.0)

## 2014-09-22 LAB — URINE MICROSCOPIC-ADD ON

## 2014-09-22 LAB — I-STAT TROPONIN, ED
Troponin i, poc: 0 ng/mL (ref 0.00–0.08)
Troponin i, poc: 0 ng/mL (ref 0.00–0.08)

## 2014-09-22 LAB — CBC
HEMATOCRIT: 41 % (ref 36.0–46.0)
Hemoglobin: 13.9 g/dL (ref 12.0–15.0)
MCH: 31.7 pg (ref 26.0–34.0)
MCHC: 33.9 g/dL (ref 30.0–36.0)
MCV: 93.4 fL (ref 78.0–100.0)
Platelets: 286 10*3/uL (ref 150–400)
RBC: 4.39 MIL/uL (ref 3.87–5.11)
RDW: 13 % (ref 11.5–15.5)
WBC: 5.9 10*3/uL (ref 4.0–10.5)

## 2014-09-22 LAB — TROPONIN I: Troponin I: <0.03 ng/mL (ref <0.031)

## 2014-09-22 LAB — BRAIN NATRIURETIC PEPTIDE: B Natriuretic Peptide: 71.9 pg/mL (ref 0.0–100.0)

## 2014-09-22 MED ORDER — ACETAMINOPHEN 325 MG PO TABS
650.0000 mg | ORAL_TABLET | ORAL | Status: DC | PRN
  Administered 2014-09-23: 650 mg via ORAL
  Filled 2014-09-22: qty 2

## 2014-09-22 MED ORDER — PANTOPRAZOLE SODIUM 40 MG PO TBEC
40.0000 mg | DELAYED_RELEASE_TABLET | Freq: Every day | ORAL | Status: DC
  Administered 2014-09-23: 40 mg via ORAL
  Filled 2014-09-22: qty 1

## 2014-09-22 MED ORDER — ENSURE COMPLETE PO LIQD
237.0000 mL | Freq: Two times a day (BID) | ORAL | Status: DC
  Administered 2014-09-23 (×2): 237 mL via ORAL

## 2014-09-22 MED ORDER — SUCRALFATE 1 G PO TABS
1.0000 g | ORAL_TABLET | Freq: Every day | ORAL | Status: DC | PRN
  Filled 2014-09-22: qty 1

## 2014-09-22 MED ORDER — CALCIUM CARBONATE-VITAMIN D 500-200 MG-UNIT PO TABS
1.0000 | ORAL_TABLET | Freq: Every day | ORAL | Status: DC
  Administered 2014-09-23: 1 via ORAL
  Filled 2014-09-22 (×2): qty 1

## 2014-09-22 MED ORDER — ASPIRIN EC 325 MG PO TBEC
325.0000 mg | DELAYED_RELEASE_TABLET | Freq: Every day | ORAL | Status: DC
  Administered 2014-09-22 – 2014-09-23 (×2): 325 mg via ORAL
  Filled 2014-09-22 (×2): qty 1

## 2014-09-22 MED ORDER — ONDANSETRON HCL 4 MG/2ML IJ SOLN: 4.0000 mg | Freq: Four times a day (QID) | INTRAMUSCULAR | Status: DC | PRN

## 2014-09-22 MED ORDER — GI COCKTAIL ~~LOC~~
30.0000 mL | Freq: Four times a day (QID) | ORAL | Status: DC | PRN
  Filled 2014-09-22: qty 30

## 2014-09-22 MED ORDER — HEPARIN SODIUM (PORCINE) 5000 UNIT/ML IJ SOLN
5000.0000 [IU] | Freq: Three times a day (TID) | INTRAMUSCULAR | Status: DC
  Administered 2014-09-22 – 2014-09-23 (×3): 5000 [IU] via SUBCUTANEOUS
  Filled 2014-09-22 (×5): qty 1

## 2014-09-22 MED ORDER — LORATADINE 10 MG PO TABS
10.0000 mg | ORAL_TABLET | Freq: Every day | ORAL | Status: DC
  Administered 2014-09-23: 10 mg via ORAL
  Filled 2014-09-22: qty 1

## 2014-09-22 MED ORDER — PNEUMOCOCCAL VAC POLYVALENT 25 MCG/0.5ML IJ INJ
0.5000 mL | INJECTION | INTRAMUSCULAR | Status: DC
  Filled 2014-09-22: qty 0.5

## 2014-09-22 MED ORDER — DULOXETINE HCL 60 MG PO CPEP
60.0000 mg | ORAL_CAPSULE | Freq: Every day | ORAL | Status: DC
  Administered 2014-09-23: 60 mg via ORAL
  Filled 2014-09-22: qty 1

## 2014-09-22 MED ORDER — NAPHAZOLINE-PHENIRAMINE 0.025-0.3 % OP SOLN
1.0000 [drp] | Freq: Every day | OPHTHALMIC | Status: DC
  Administered 2014-09-23: 1 [drp] via OPHTHALMIC
  Filled 2014-09-22: qty 15

## 2014-09-22 MED ORDER — CETYLPYRIDINIUM CHLORIDE 0.05 % MT LIQD
7.0000 mL | Freq: Two times a day (BID) | OROMUCOSAL | Status: DC
  Administered 2014-09-23: 7 mL via OROMUCOSAL

## 2014-09-22 MED ORDER — MORPHINE SULFATE 2 MG/ML IJ SOLN: 2.0000 mg | INTRAMUSCULAR | Status: DC | PRN

## 2014-09-22 MED ORDER — CALCIUM CARBONATE-VITAMIN D 500-125 MG-UNIT PO TABS: ORAL_TABLET | Freq: Every day | ORAL | Status: DC

## 2014-09-22 MED ORDER — SODIUM CHLORIDE 0.9 % IV SOLN
INTRAVENOUS | Status: DC
  Administered 2014-09-22: 20:00:00 via INTRAVENOUS

## 2014-09-22 MED ORDER — ZOLPIDEM TARTRATE 5 MG PO TABS: 5.0000 mg | ORAL_TABLET | Freq: Every evening | ORAL | Status: DC | PRN

## 2014-09-22 MED ORDER — ALPRAZOLAM 0.25 MG PO TABS
0.0625 mg | ORAL_TABLET | Freq: Two times a day (BID) | ORAL | Status: DC
  Administered 2014-09-22: 0.125 mg via ORAL
  Filled 2014-09-22: qty 1

## 2014-09-22 NOTE — ED Notes (Signed)
PT monitored by pulse ox, bp cuff, and 5-lead. 

## 2014-09-22 NOTE — ED Notes (Signed)
Meal tray ordered 

## 2014-09-22 NOTE — H&P (Signed)
[facility] History and Physical  Kimberly Werner ZOX:[id number] DOB: [birth year] DOA: 09/22/2014  Referring physician: Alvira Monday, MD PCP: Ginette Otto, MD   Chief Complaint: Chest tightness  HPI: Kimberly Werner is a 81 y.o. female presents with chest tightness. She states that he has been having palpitation and irregular heart beat. She noted that she was also having rapid heart beat. She also states that she had a funny feeling in her chest. She states that it was feeling tight. She states that she was not light headed and had no dizziness. She did not feel that she was going to pass out. She states that she felt a little weird. She has no prior history of heart disease. Not a smoker.  In December she had an episode where she was having a lot of difficulty speaking. Patient was speaking gibberish according to herr daughter. She has not had this occur since that time. She is on multiple medications including OTC phenylephrine and also excedrin migraine and visine eye drops   Review of Systems:  Constitutional:  No weight loss, night sweats, Fevers, chills, fatigue.  HEENT:  No headaches, Difficulty swallowing,Tooth/dental problems,Sore throat,  No sneezing Cardio-vascular:  No chest pain, ++chest tightness Orthopnea, no swelling in lower extremities, anasarca, dizziness, ++palpitations  GI:  No heartburn, indigestion, abdominal pain, nausea, vomiting, diarrhea Resp:  No shortness of breath with exertion or at rest. No excess mucus, no productive cough, No non-productive cough, No coughing up of blood.No change in color of mucus Skin:  no rash or lesions GU:  no dysuria, change in color of urine, no urgency or frequency Musculoskeletal:  No joint pain or swelling. No decreased range of motion Psych:  No change in mood or affect. No depression or anxiety  Past Medical History  Diagnosis Date  . Hyperlipidemia   . Anxiety   . COPD (chronic obstructive pulmonary  disease)    Past Surgical History  Procedure Laterality Date  . Eyes surgery  19yrs ago  . Masectomy  80yrs    double  . Eye surgery     Social History:  reports that she quit smoking about 61 years ago. She does not have any smokeless tobacco history on file. She reports that she does not drink alcohol or use illicit drugs.  No Known Allergies  Family History  Problem Relation Age of Onset  . COPD Father   . Cancer Mother     breast     Prior to Admission medications   Medication Sig Start Date End Date Taking? Authorizing Provider  ALPRAZolam Prudy Feeler) 0.5 MG tablet Take 0.0625-0.125 mg by mouth 2 (two) times daily. 1/8 to 1/4 tablet 03/04/13  Yes Historical Provider, MD  aspirin-acetaminophen-caffeine (EXCEDRIN MIGRAINE) [phone] MG per tablet Take 1 tablet by mouth daily as needed for headache or migraine.   Yes Historical Provider, MD  Calcium Carbonate-Vitamin D (CALCIUM 500 + D PO) Take 1 tablet by mouth daily.   Yes Historical Provider, MD  DULoxetine (CYMBALTA) 60 MG capsule Take 60 mg by mouth daily.   Yes Historical Provider, MD  ibuprofen (ADVIL,MOTRIN) 200 MG tablet Take 200 mg by mouth every 6 (six) hours as needed (pain).   Yes Historical Provider, MD  loratadine (CLARITIN) 10 MG tablet Take 10 mg by mouth daily.   Yes Historical Provider, MD  Multiple Vitamin (MULTIVITAMIN WITH MINERALS) TABS Take 1 tablet by mouth daily.   Yes Historical Provider, MD  naphazoline-pheniramine (VISINE-A) 0.025-0.3 % ophthalmic solution Place  1 drop into both eyes daily.   Yes Historical Provider, MD  pantoprazole (PROTONIX) 40 MG tablet Take 40 mg by mouth daily.  03/13/13  Yes Historical Provider, MD  phenylephrine (SUDAFED PE) 10 MG TABS tablet Take 10 mg by mouth daily as needed (congestion).   Yes Historical Provider, MD  sucralfate (CARAFATE) 1 G tablet Take 1 g by mouth daily as needed (GERD).  01/19/14  Yes Historical Provider, MD  HYDROcodone-acetaminophen (HYCET) 7.5-325 mg/15 ml  solution Take 10 mLs by mouth 4 (four) times daily as needed for moderate pain. Patient not taking: Reported on 09/22/2014 02/12/14 02/12/15  Glynn Octave, MD   Physical Exam: Filed Vitals:   09/22/14 1630 09/22/14 1700 09/22/14 1730 09/22/14 1800  BP: 143/85 138/102 156/76 144/88  Pulse: 61 66 79 65  Resp: SpO2: 97% 95% 97% 96%    Wt Readings from Last 3 Encounters:  02/12/14 50.803 kg (112 lb)  10/12/13 50.349 kg (111 lb)  04/09/13 52.345 kg (115 lb 6.4 oz)    General:  Appears calm and comfortable Eyes: PERRL, normal lids, irises & conjunctiva ENT: grossly normal hearing, lips & tongue Neck: no LAD, masses or thyromegaly Cardiovascular: RRR, no m/r/g. No LE edema. Respiratory: CTA bilaterally, no w/r/r. Normal respiratory effort. Abdomen: soft, ntnd Skin: no rash or induration seen on limited exam Musculoskeletal: grossly normal tone BUE/BLE Psychiatric: grossly normal mood and affect, speech fluent and appropriate Neurologic: grossly non-focal.          Labs on Admission:  Basic Metabolic Panel:  Recent Labs Lab 09/22/14 1524  NA 139  K 4.0  CL 106  CO2 29  GLUCOSE 106*  BUN 11  CREATININE 0.70  CALCIUM 9.9   Liver Function Tests: No results for input(s): AST, ALT, ALKPHOS, BILITOT, PROT, ALBUMIN in the last 168 hours. No results for input(s): LIPASE, AMYLASE in the last 168 hours. No results for input(s): AMMONIA in the last 168 hours. CBC:  Recent Labs Lab 09/22/14 1524  WBC 5.9  HGB 13.9  HCT 41.0  MCV 93.4  PLT 286   Cardiac Enzymes: No results for input(s): CKTOTAL, CKMB, CKMBINDEX, TROPONINI in the last 168 hours.  BNP (last 3 results) No results for input(s): PROBNP in the last 8760 hours. CBG: No results for input(s): GLUCAP in the last 168 hours.  Radiological Exams on Admission: Dg Chest 2 View  09/22/2014   CLINICAL DATA:  Shortness of breath for 3 weeks which has worsened today. Chest tightness. Initial encounter.   EXAM: CHEST  2 VIEW  COMPARISON:  PA and lateral chest 12/13/2009 and 02/06/2014.  FINDINGS: The chest is hyperexpanded with attenuation of the pulmonary vasculature but the lungs are clear. Heart size is normal. No pneumothorax pleural effusion. Surgical clip projecting over the periphery of the right breast is noted.  IMPRESSION: The lungs appear emphysematous without acute disease.   Electronically Signed   By: Drusilla Kanner M.D.   On: 09/22/2014 16:00      Assessment/Plan Principal Problem:   Chest tightness or pressure Active Problems:   Hyperlipidemia   Palpitations   Obstructive chronic bronchitis without exacerbation   Dysarthria   Chest tightness   1. Chest tightness -will admit for observation -will check serial enzymes -will get echo in am -she is also on multiple stimulant medications which may be contributing to her symptoms and I have discussed this with her  2. Hyperlipidemia -will continue with home medications  3. Palpitations -  possibly related to stimulant medications -will monitor on telemetry for arrhythmias  4. COPD -currently stable -will monitor  5. Dysarthria possible TIA -this occurred several weeks ago -will check a carotid doppler -will get an echo -check TSH and A1C     Code Status: Full Code(must indicate code status--if unknown or must be presumed, indicate so) DVT Prophylaxis:Heparin Family Communication: Daughters (indicate person spoken with, if applicable, with phone number if by telephone) Disposition Plan: home (indicate anticipated LOS)  Time spent: 60min  [facility]AN,SAADAT A [facility] Pager [phone]

## 2014-09-22 NOTE — ED Notes (Signed)
Pt here for left shoulder pain and the feeling of her heart being irregular. Denies SOB.

## 2014-09-22 NOTE — ED Notes (Signed)
MD Gentry at bedside.

## 2014-09-22 NOTE — ED Notes (Signed)
PA Mercedes at bedside.  

## 2014-09-22 NOTE — ED Provider Notes (Signed)
CSN: [id number]     Arrival date & time 09/22/14  1507 History   First MD Initiated Contact with Patient 09/22/14 1527     Chief Complaint  Patient presents with  . Shoulder Pain  . Irregular Heart Beat     (Consider location/radiation/quality/duration/timing/severity/associated sxs/prior Treatment) HPI Comments: Kimberly Werner is a 81 y.o. female with a PMHx of HLD, anxiety, and COPD, who presents to the ED with complaints of palpitations and left shoulder pain. Patient states that she has had an irregular heartbeat in the past, and that earlier this morning it was beating irregularly although not fast, and states that she had some discomfort which she can only describe as a "funny feeling" but not pain. She states that her left shoulder had a "steady" 5/10 pain which she cannot further describe, but has had some improvement since this morning, with no known aggravating or alleviating factors. She states that she has broken the shoulder in the past, but that the pain from the shoulder fracture had resolved entirely, and that this does not feel similar to the pain she had had with the shoulder injury. She cannot describe it any further and has difficulty explaining where exactly the pain is. She has not tried any medications for her symptoms. She does also report that her right leg was swollen 4 days ago, but that it has now improved, stating that from the knee down to the ankle there was swelling which she is unsure of why it began, but it is gone down entirely. She denies any erythema or pain in this leg at the time of the swelling. She also states that a few weeks ago, she had an episode of "speaking gibberish" at which time she saw her doctor, who told her to follow-up in one week but she did not. He resolved entirely on its own, and she had no further workup done at that time. She has seen a cardiologist for her regular heartbeat once before in 2014, and had nothing done at that time. She is not on  any medications for her heart. She denies any fevers, chills, shortness of breath, chest pain, hemoptysis, diaphoresis, orthopnea, PND, abdominal pain, nausea, vomiting, diarrhea, constipation, dysuria, hematuria, numbness, tingling, or weakness.   Patient is a 81 y.o. female presenting with shoulder pain. The history is provided by the patient. No language interpreter was used.  Shoulder Pain Location:  Shoulder Time since incident:  10 hours Injury: no   Shoulder location:  L shoulder Pain details:    Quality:  Unable to specify ("steady")   Radiates to:  Does not radiate   Severity:  Mild (5/10)   Onset quality:  Gradual   Duration:  10 hours   Timing:  Constant   Progression:  Improving Chronicity:  New Prior injury to area:  Yes Relieved by:  None tried Worsened by:  Nothing tried Ineffective treatments:  None tried Associated symptoms: no back pain, no decreased range of motion, no fever, no muscle weakness, no neck pain, no numbness, no stiffness, no swelling and no tingling     Past Medical History  Diagnosis Date  . Hyperlipidemia   . Anxiety   . COPD (chronic obstructive pulmonary disease)    Past Surgical History  Procedure Laterality Date  . Eyes surgery  6383yrs ago  . Masectomy  [age]yrs    double  . Eye surgery     Family History  Problem Relation Age of Onset  . COPD Father   .  Cancer Mother     breast   History  Substance Use Topics  . Smoking status: Former Smoker    Quit date: 04/09/1953  . Smokeless tobacco: Not on file  . Alcohol Use: No   OB History    No data available     Review of Systems  Constitutional: Negative for fever and chills.  Eyes: Negative for visual disturbance.  Respiratory: Negative for cough and shortness of breath.   Cardiovascular: Positive for palpitations and leg swelling (RLE swelling 4 days ago, now resolved). Negative for chest pain.  Gastrointestinal: Negative for nausea, vomiting, abdominal pain, diarrhea,  constipation and blood in stool.  Genitourinary: Negative for dysuria and hematuria.  Musculoskeletal: Positive for arthralgias (L shoulder). Negative for myalgias, back pain, joint swelling, stiffness and neck pain.  Skin: Negative for color change.  Neurological: Positive for speech difficulty (several weeks ago, none today). Negative for dizziness, weakness, light-headedness, numbness and headaches.  Psychiatric/Behavioral: Negative for confusion.   10 Systems reviewed and are negative for acute change except as noted in the HPI.    Allergies  Review of patient's allergies indicates no known allergies.  Home Medications   Prior to Admission medications   Medication Sig Start Date End Date Taking? Authorizing Provider  ALPRAZolam Prudy Feeler) 0.5 MG tablet Take 0.25 mg by mouth 2 (two) times daily as needed for anxiety or sleep.  03/04/13   Historical Provider, MD  Cholecalciferol (VITAMIN D3) 1000 UNITS CAPS Take 1,000 Units by mouth daily.     Historical Provider, MD  DULoxetine (CYMBALTA) 60 MG capsule Take 60 mg by mouth daily.    Historical Provider, MD  HYDROcodone-acetaminophen (HYCET) 7.5-325 mg/15 ml solution Take 10 mLs by mouth 4 (four) times daily as needed for moderate pain. 02/12/14 02/12/15  Glynn Octave, MD  Ibuprofen (ADVIL PO) Take 1 tablet by mouth daily as needed (pain).     Historical Provider, MD  loratadine (CLARITIN) 10 MG tablet Take 10 mg by mouth daily.    Historical Provider, MD  Multiple Vitamin (MULTIVITAMIN WITH MINERALS) TABS Take 1 tablet by mouth daily.    Historical Provider, MD  Oxycodone HCl 10 MG TABS Take 10 mg by mouth every 4 (four) hours as needed. pain 02/15/14   Historical Provider, MD  pantoprazole (PROTONIX) 40 MG tablet Take 40 mg by mouth daily.  03/13/13   Historical Provider, MD  sucralfate (CARAFATE) 1 G tablet Take 1 g by mouth daily. 01/19/14   Historical Provider, MD   BP 147/74 mmHg  Pulse 63  Resp 17  SpO2 100% Physical Exam   Constitutional: She is oriented to person, place, and time. Vital signs are normal. She appears well-developed and well-nourished.  Non-toxic appearance. No distress.  Afebrile, nontoxic, NAD, thin elderly female  HENT:  Head: Normocephalic and atraumatic.  Mouth/Throat: Mucous membranes are normal.  Eyes: Conjunctivae and EOM are normal. Right eye exhibits no discharge. Left eye exhibits no discharge.  Neck: Normal range of motion. Neck supple. JVD present. Carotid bruit is not present.  Mild JVD  Cardiovascular: Normal rate, regular rhythm, normal heart sounds and intact distal pulses.  Exam reveals no gallop and no friction rub.   No murmur heard. RRR, nl s1/s2, no m/r/g, distal pulses intact, no pedal edema  Pulmonary/Chest: Effort normal and breath sounds normal. No respiratory distress. She has no decreased breath sounds. She has no wheezes. She has no rhonchi. She has no rales.  Abdominal: Soft. Normal appearance and bowel sounds are  normal. She exhibits no distension. There is no tenderness. There is no rigidity, no rebound and no guarding.  Musculoskeletal: Normal range of motion.       Left shoulder: She exhibits crepitus. She exhibits normal range of motion, no bony tenderness, no swelling, no effusion, no deformity, normal pulse and normal strength.  L shoulder with baseline ROM intact, no bony TTP, no swelling or effusion, mild crepitus anteriorly without deformity, distal pulses intact, strength 5/5 in all extremities, sensation grossly intact  Neurological: She is alert and oriented to person, place, and time. She has normal strength. No sensory deficit.  Skin: Skin is warm, dry and intact. No rash noted.  Psychiatric: She has a normal mood and affect.  Nursing note and vitals reviewed.   ED Course  Procedures (including critical care time) Labs Review Labs Reviewed  BASIC METABOLIC PANEL - Abnormal; Notable for the following:    Glucose, Bld 106 (*)    GFR calc non Af  Amer 78 (*)    Anion gap 4 (*)    All other components within normal limits  CBC  BRAIN NATRIURETIC PEPTIDE  URINALYSIS, ROUTINE W REFLEX MICROSCOPIC  I-STAT TROPOININ, ED  Rosezena Sensor, ED    Imaging Review Dg Chest 2 View  09/22/2014   CLINICAL DATA:  Shortness of breath for 3 weeks which has worsened today. Chest tightness. Initial encounter.  EXAM: CHEST  2 VIEW  COMPARISON:  PA and lateral chest 12/13/2009 and 02/06/2014.  FINDINGS: The chest is hyperexpanded with attenuation of the pulmonary vasculature but the lungs are clear. Heart size is normal. No pneumothorax pleural effusion. Surgical clip projecting over the periphery of the right breast is noted.  IMPRESSION: The lungs appear emphysematous without acute disease.   Electronically Signed   By: Drusilla Kanner M.D.   On: 09/22/2014 16:00     EKG Interpretation   Date/Time:  Wednesday September 22 2014 16:24:09 EST Ventricular Rate:  60 PR Interval:  154 QRS Duration: 90 QT Interval:  414 QTC Calculation: 414 R Axis:   65 Text Interpretation:  Sinus rhythm No significant change since last  tracing Confirmed by Mirian Mo [phone]) on 09/22/2014 4:28:27 PM      MDM   Final diagnoses:  Chest tightness  Palpitations  Left shoulder pain    81 y.o. female with palpitations and L shoulder discomfort, difficulty with describing her current symptoms. NSR on EKG. Trop neg. CBC WNL, BMP WNL. CXR with emphysematous changes but stable. Overall well appearing. Chart review reveals one prior cardiology visit in 03/2013 for similar complaints. Difficult to determine etiology, but most concerning symptom is the TIA-like symptoms recently. ABCD2 score moderate (3 points although duration of symptoms not clear therefore may be higher). Will discuss case with Dr. Littie Deeds who will see pt.  5:38 PM Dr. Littie Deeds saw pt, who agrees that pt should be observed for ACS rule out and likely MRI for her TIA-like symptoms. Will consult for  admission.  6:16 PM Dr. Hilton Cork returning page, will admit. Please see his dictation for further documentation of care. Pt stable at this time. Also, BNP WNL.  BP 144/88 mmHg  Pulse 65  Resp 17  SpO2 96%    [place], PA-C 09/22/14 1818  Mirian Mo, MD 09/24/14 [phone]

## 2014-09-22 NOTE — ED Notes (Signed)
Hospitalist at bedside 

## 2014-09-23 ENCOUNTER — Observation Stay (HOSPITAL_COMMUNITY): Payer: Medicare Other

## 2014-09-23 DIAGNOSIS — R479 Unspecified speech disturbances: Secondary | ICD-10-CM | POA: Insufficient documentation

## 2014-09-23 DIAGNOSIS — F419 Anxiety disorder, unspecified: Secondary | ICD-10-CM | POA: Diagnosis not present

## 2014-09-23 DIAGNOSIS — I519 Heart disease, unspecified: Secondary | ICD-10-CM

## 2014-09-23 DIAGNOSIS — R490 Dysphonia: Secondary | ICD-10-CM

## 2014-09-23 DIAGNOSIS — M25512 Pain in left shoulder: Secondary | ICD-10-CM | POA: Diagnosis not present

## 2014-09-23 DIAGNOSIS — E785 Hyperlipidemia, unspecified: Secondary | ICD-10-CM | POA: Diagnosis not present

## 2014-09-23 DIAGNOSIS — E440 Moderate protein-calorie malnutrition: Secondary | ICD-10-CM | POA: Insufficient documentation

## 2014-09-23 DIAGNOSIS — R471 Dysarthria and anarthria: Secondary | ICD-10-CM

## 2014-09-23 DIAGNOSIS — R002 Palpitations: Secondary | ICD-10-CM

## 2014-09-23 DIAGNOSIS — R0789 Other chest pain: Secondary | ICD-10-CM

## 2014-09-23 LAB — TSH: TSH: 1.998 u[IU]/mL (ref 0.350–4.500)

## 2014-09-23 LAB — HEMOGLOBIN A1C
HEMOGLOBIN A1C: 5.5 % (ref <5.7)
MEAN PLASMA GLUCOSE: 111 mg/dL (ref <117)

## 2014-09-23 LAB — TROPONIN I

## 2014-09-23 MED ORDER — FLUTICASONE PROPIONATE 50 MCG/ACT NA SUSP: 2.0000 | Freq: Every day | NASAL | Status: DC

## 2014-09-23 MED ORDER — ASPIRIN 325 MG PO TBEC: 325.0000 mg | DELAYED_RELEASE_TABLET | Freq: Every day | ORAL | Status: DC

## 2014-09-23 MED ORDER — ENSURE COMPLETE PO LIQD: 237.0000 mL | Freq: Three times a day (TID) | ORAL | Status: DC

## 2014-09-23 MED ORDER — ACETAMINOPHEN 325 MG PO TABS: 650.0000 mg | ORAL_TABLET | ORAL | Status: DC | PRN

## 2014-09-23 NOTE — Progress Notes (Signed)
INITIAL NUTRITION ASSESSMENT  Pt meets criteria for NON-SEVERE (MODERATE) MALNUTRITION in the context of chronic illness as evidenced by moderate fat mass and moderate to severe muscle mass loss.  DOCUMENTATION CODES Per approved criteria  -Non-severe (moderate) malnutrition in the context of chronic illness   INTERVENTION: Provide Ensure Complete po TID, each supplement provides 350 kcal and 13 grams of protein.  Encourage adequate PO intake.   NUTRITION DIAGNOSIS: Increased nutrient needs related to chronic illness, COPD as evidenced by estimated nutrition needs.   Goal: Pt to meet >/= 90% of their estimated nutrition needs   Monitor:  PO intake, weight trends, labs, I/O's  Reason for Assessment: MST  81 y.o. female  Admitting Dx: Chest tightness or pressure  ASSESSMENT: Pt presents with chest tightness. She states that he has been having palpitation and irregular heart beat.  Pt reports her appetite has been just "ok". Current meal completion has been 50-100%. She reports she has been eating 3 meals a day, however her appetite has been coming and going due to stress. She reports she drinks at least 2 Ensure drinks a day, however noticed she has lost a couple of pounds over the past month. Pt was educated to drink at least 2-3 Ensure drinks a day to aid in caloric and protein needs as well as preventing further weight loss. Pt expressed understanding. Pt was encouraged to eat her food at meals. RD to order Ensure TID.  Nutrition Focused Physical Exam:  Subcutaneous Fat:  Orbital Region: N/A Upper Arm Region: Moderate depletion Thoracic and Lumbar Region: Moderate depletion  Muscle:  Temple Region: N/A Clavicle Bone Region: Severe depletion Clavicle and Acromion Bone Region: Severe depletion Scapular Bone Region: N/A Dorsal Hand: N/A Patellar Region: Severe depletion Anterior Thigh Region: Moderate depletion Posterior Calf Region: Moderate depletion  Edema:  none  Labs and medications reviewed.  Height: Ht Readings from Last 1 Encounters:  09/22/14 5\' 5"  (1.651 m)    Weight: Wt Readings from Last 1 Encounters:  09/22/14 112 lb 3.2 oz (50.894 kg)    Ideal Body Weight: 125 lbs  % Ideal Body Weight: 90%  Wt Readings from Last 10 Encounters:  09/22/14 112 lb 3.2 oz (50.894 kg)  02/12/14 112 lb (50.803 kg)  10/12/13 111 lb (50.349 kg)  04/09/13 115 lb 6.4 oz (52.345 kg)    Usual Body Weight: 112 lbs  % Usual Body Weight: 100%  BMI:  Body mass index is 18.67 kg/(m^2).  Estimated Nutritional Needs: Kcal: 1650-1850 Protein: 70-85 grams Fluid: 1.65 - 1.85 L/day  Skin: Intact  Diet Order: Diet Heart  EDUCATION NEEDS: -Education needs addressed   Intake/Output Summary (Last 24 hours) at 09/23/14 1018 Last data filed at 09/23/14 0900  Gross per 24 hour  Intake    120 ml  Output      0 ml  Net    120 ml    Last BM: 1/6  Labs:   Recent Labs Lab 09/22/14 1524  NA 139  K 4.0  CL 106  CO2 29  BUN 11  CREATININE 0.70  CALCIUM 9.9  GLUCOSE 106*    CBG (last 3)  No results for input(s): GLUCAP in the last 72 hours.  Scheduled Meds: . ALPRAZolam  0.125 mg Oral BID  . antiseptic oral rinse  7 mL Mouth Rinse BID  . aspirin EC  325 mg Oral Daily  . calcium-vitamin D  1 tablet Oral Q breakfast  . DULoxetine  60 mg Oral Daily  .  feeding supplement (ENSURE COMPLETE)  237 mL Oral BID BM  . heparin  5,000 Units Subcutaneous 3 times per day  . loratadine  10 mg Oral Daily  . naphazoline-pheniramine  1 drop Both Eyes Daily  . pantoprazole  40 mg Oral Daily  . pneumococcal 23 valent vaccine  0.5 mL Intramuscular Tomorrow-1000    Continuous Infusions: . sodium chloride 50 mL/hr at 09/22/14 2023    Past Medical History  Diagnosis Date  . Hyperlipidemia   . Anxiety   . COPD (chronic obstructive pulmonary disease)     Past Surgical History  Procedure Laterality Date  . Eyes surgery  36yrs ago  . Masectomy   75yrs    double  . Eye surgery      Marijean Niemann, MS, RD, LDN Pager # [phone] After hours/ weekend pager # [phone]

## 2014-09-23 NOTE — Progress Notes (Signed)
*  PRELIMINARY RESULTS* Echocardiogram 2D Echocardiogram has been performed.  Kimberly Werner, Kimberly Werner 09/23/2014, 12:46 PM

## 2014-09-23 NOTE — Progress Notes (Signed)
UR completed 

## 2014-09-23 NOTE — Progress Notes (Signed)
VASCULAR LAB PRELIMINARY  PRELIMINARY  PRELIMINARY  PRELIMINARY  Carotid Dopplers completed.    Preliminary report:  1-39% ICA stenosis.  Vertebral artery flow is antegrade.   Kiptyn Rafuse, RVT 09/23/2014, 3:38 PM

## 2014-09-23 NOTE — Progress Notes (Signed)
Pt. Got d/c instructions,prescriptions and follow up appoinments.IV was removed,Tele was d/c.Pt. Ready to go home with daughters.

## 2014-09-23 NOTE — Discharge Summary (Signed)
Physician Discharge Summary  Kimberly ChinaHelen M Werner ZOX:[id number] DOB: 09/18/1928 DOA: 09/22/2014  PCP: Ginette OttoSTONEKING,HAL THOMAS, MD  Admit date: 09/22/2014 Discharge date: 09/23/2014  Discharge Diagnoses:  Principal Problem:   Chest tightness or pressure Active Problems:   Hyperlipidemia   Palpitations   Obstructive chronic bronchitis without exacerbation   Dysarthria several weeks prior to admission.  Suspect TIA   Malnutrition of moderate degree   Discharge Condition: stable  Filed Weights   09/22/14 2030  Weight: 50.894 kg (112 lb 3.2 oz)    History of present illness:  81 y.o. female presents with chest tightness. She states that he has been having palpitation and irregular heart beat. She noted that she was also having rapid heart beat. She also states that she had a funny feeling in her chest. She states that it was feeling tight. She states that she was not light headed and had no dizziness. She did not feel that she was going to pass out. She states that she felt a little weird. She has no prior history of heart disease. Not a smoker.  In December she had an episode where she was having a lot of difficulty speaking. Patient was speaking gibberish according to herr daughter. She has not had this occur since that time. She is on multiple medications including OTC phenylephrine and also excedrin migraine and visine eye drops   Hospital Course:  Observed on telemetry.  Had periods of frequent PVCs.  MI ruled out.  Echo with no wall motion abnormalities, normal EF and no source of embolus.  Doppler carotids without significant stenosis. CT brain showed nothing acute. May have had a recent TIA.  Started on ASA and encouraged to stop decongestants.  Recommend outpatient fasting lipid profile  Procedures:  none  Consultations:  none  Discharge Exam: Filed Vitals:   09/23/14 1347  BP: 128/61  Pulse: 63  Temp: 98.4 F (36.9 C)  Resp: 16    General: a and o Cardiovascular:  RRR Respiratory: CTA Neuro: nonfocal. Speech clear and fluent  Discharge Instructions   Discharge Instructions    Activity as tolerated - No restrictions    Complete by:  As directed      Diet general    Complete by:  As directed      Discharge instructions    Complete by:  As directed   May take mucinex, guaifenesin, saline nasal spray as needed for congestion, post nasal drip          Current Discharge Medication List    START taking these medications   Details  acetaminophen (TYLENOL) 325 MG tablet Take 2 tablets (650 mg total) by mouth every 4 (four) hours as needed for headache or mild pain.    aspirin EC 325 MG EC tablet Take 1 tablet (325 mg total) by mouth daily. Qty: 30 tablet, Refills: 0    fluticasone (FLONASE) 50 MCG/ACT nasal spray Place 2 sprays into both nostrils daily. Qty: 16 g, Refills: 0      CONTINUE these medications which have NOT CHANGED   Details  ALPRAZolam (XANAX) 0.5 MG tablet Take 0.0625-0.125 mg by mouth 2 (two) times daily. 1/8 to 1/4 tablet    Calcium Carbonate-Vitamin D (CALCIUM 500 + D PO) Take 1 tablet by mouth daily.    DULoxetine (CYMBALTA) 60 MG capsule Take 60 mg by mouth daily.    ibuprofen (ADVIL,MOTRIN) 200 MG tablet Take 200 mg by mouth every 6 (six) hours as needed (pain).    loratadine (  CLARITIN) 10 MG tablet Take 10 mg by mouth daily.    Multiple Vitamin (MULTIVITAMIN WITH MINERALS) TABS Take 1 tablet by mouth daily.    pantoprazole (PROTONIX) 40 MG tablet Take 40 mg by mouth daily.     sucralfate (CARAFATE) 1 G tablet Take 1 g by mouth daily as needed (GERD).       STOP taking these medications     aspirin-acetaminophen-caffeine (EXCEDRIN MIGRAINE) 250-250-65 MG per tablet      naphazoline-pheniramine (VISINE-A) 0.025-0.3 % ophthalmic solution      phenylephrine (SUDAFED PE) 10 MG TABS tablet      HYDROcodone-acetaminophen (HYCET) 7.5-325 mg/15 ml solution        No Known Allergies Follow-up Information     Follow up with Ginette Otto, MD In 3 weeks.   Specialty:  Internal Medicine   Contact information:   [address] [place] [address] [phone]        The results of significant diagnostics from this hospitalization (including imaging, microbiology, ancillary and laboratory) are listed below for reference.    Significant Diagnostic Studies: Dg Chest 2 View  09/22/2014   CLINICAL DATA:  Shortness of breath for 3 weeks which has worsened today. Chest tightness. Initial encounter.  EXAM: CHEST  2 VIEW  COMPARISON:  PA and lateral chest 12/13/2009 and 02/06/2014.  FINDINGS: The chest is hyperexpanded with attenuation of the pulmonary vasculature but the lungs are clear. Heart size is normal. No pneumothorax pleural effusion. Surgical clip projecting over the periphery of the right breast is noted.  IMPRESSION: The lungs appear emphysematous without acute disease.   Electronically Signed   By: Drusilla Kanner M.D.   On: 09/22/2014 16:00   Ct Head Wo Contrast  09/23/2014   CLINICAL DATA:  Additional evaluation for a single episode of garbled speech December 2015, currently experiencing chest tightness and hypertension  EXAM: CT HEAD WITHOUT CONTRAST  TECHNIQUE: Contiguous axial images were obtained from the base of the skull through the vertex without intravenous contrast.  COMPARISON:  12/13/2009  FINDINGS: No abnormal attenuation to suggest vascular territory infarct. No hemorrhage or extra-axial fluid. No hydrocephalus. Moderate diffuse atrophy and mild low attenuation in the deep white matter. 1 cm calcified nodule in the medial right temporal lobe. This is stable and likely benign possibly of vascular malformation.  No significant inflammatory change in the visualized portions of the sinuses. Calvarium is intact.  IMPRESSION: Chronic involutional change. Chronic calcified lesion right temporal lobe likely either a benign cavernoma or Hamm are trauma.   Electronically  Signed   By: Esperanza Heir M.D.   On: 09/23/2014 16:03   Echo Left ventricle: The cavity size was normal. Wall thickness was normal. Systolic function was normal. The estimated ejection fraction was in the range of 55% to 60%. Although no diagnostic regional wall motion abnormality was identified, this possibility cannot be completely excluded on the basis of this study. Doppler parameters are consistent with abnormal left ventricular relaxation (grade 1 diastolic dysfunction).  Impressions:  - No cardiac source of emboli was indentified.  Carotid doppler Bilateral: mild mixed plaque origin ICA. 1-39% ICA stenosis. Vertebral artery flow is antegrade.  EKG Sinus bradycardia. Rate 58  Microbiology: No results found for this or any previous visit (from the past 240 hour(s)).   Labs: Basic Metabolic Panel:  Recent Labs Lab 09/22/14 1524  NA 139  K 4.0  CL 106  CO2 29  GLUCOSE 106*  BUN 11  CREATININE  0.70  CALCIUM 9.9   Liver Function Tests: No results for input(s): AST, ALT, ALKPHOS, BILITOT, PROT, ALBUMIN in the last 168 hours. No results for input(s): LIPASE, AMYLASE in the last 168 hours. No results for input(s): AMMONIA in the last 168 hours. CBC:  Recent Labs Lab 09/22/14 1524  WBC 5.9  HGB 13.9  HCT 41.0  MCV 93.4  PLT 286   Cardiac Enzymes:  Recent Labs Lab 09/22/14 2056 09/22/14 2253 09/23/14 0243  TROPONINI <0.03 <0.03 <0.03   BNP: BNP (last 3 results) No results for input(s): PROBNP in the last 8760 hours. CBG: No results for input(s): GLUCAP in the last 168 hours.     SignedChristiane Ha  [facility] 09/23/2014, 4:56 PM

## 2014-09-23 NOTE — Progress Notes (Signed)
   09/23/14 1100  Clinical Encounter Type  Visited With Patient;Health care provider  Visit Type Initial;Spiritual support;Social support  Spiritual Encounters  Spiritual Needs Prayer;Emotional;Grief support  Stress Factors  Patient Stress Factors Financial concerns;Loss;Loss of control   Chaplain was referred to patient via spiritual care consult. Patient primarily described herself as worried today. Patient wanted to talk to chaplain about her many worries today. One major worry of the patient has been her daughters. Patient's ex-husband passed away two weeks ago in this hospital and now that she is in this hospital she knows it has been difficult on them. Patient is also worried about getting home. Patient does not feel she needs to be in the hospital and says she is increasingly worried about the financial burden of being in the hospital. Patient's religion is important to her and she has been a part of the [employer] for several decades. Currently, patient is experiencing further grief by her recent decision to leave her church of over fifty years. This has been a painful loss in the patient's life but she knows she had to make the decision to leave because she felt unsupported by the pastor. Patient's daughters have been supportive during this time and she says they don't like leaving her alone even though she says she is fine. Patient asked for prayer and chaplain prayed with patient. Chaplain will continue to provide emotional and spiritual support for patient and patient's family as needed. Calise Dunckel, Tommi EmeryBlake R, Chaplain  11:19 AM

## 2014-11-19 ENCOUNTER — Other Ambulatory Visit: Payer: Self-pay | Admitting: Nurse Practitioner

## 2014-11-19 DIAGNOSIS — M79606 Pain in leg, unspecified: Secondary | ICD-10-CM

## 2014-11-25 ENCOUNTER — Ambulatory Visit
Admission: RE | Admit: 2014-11-25 | Discharge: 2014-11-25 | Disposition: A | Discharge status: Home / Self Care | Payer: Medicare Other | Source: Ambulatory Visit | Attending: Nurse Practitioner | Admitting: Nurse Practitioner

## 2014-11-25 DIAGNOSIS — M79606 Pain in leg, unspecified: Secondary | ICD-10-CM

## 2015-03-01 ENCOUNTER — Ambulatory Visit
Admission: RE | Admit: 2015-03-01 | Discharge: 2015-03-01 | Disposition: A | Discharge status: Home / Self Care | Payer: Medicare Other | Source: Ambulatory Visit | Attending: Physician Assistant | Admitting: Physician Assistant

## 2015-03-01 ENCOUNTER — Other Ambulatory Visit: Payer: Self-pay | Admitting: Physician Assistant

## 2015-03-01 DIAGNOSIS — R140 Abdominal distension (gaseous): Secondary | ICD-10-CM

## 2015-03-24 ENCOUNTER — Emergency Department (HOSPITAL_COMMUNITY)
Admission: EM | Admit: 2015-03-24 | Discharge: 2015-03-24 | Disposition: A | Discharge status: Home / Self Care | Payer: Medicare Other | Attending: Emergency Medicine | Admitting: Emergency Medicine

## 2015-03-24 ENCOUNTER — Encounter (HOSPITAL_COMMUNITY): Payer: Self-pay | Admitting: Emergency Medicine

## 2015-03-24 NOTE — ED Provider Notes (Signed)
Medical Screening Exam initiated at 1440.  Patient awake and alert, HD stable.  No CP.  One episode of brbpr.   EKG Interpretation  Date/Time:  Thursday March 24 2015 14:25:42 EDT Ventricular Rate:  82 PR Interval:  143 QRS Duration: 138 QT Interval:  432 QTC Calculation: 505 R Axis:   -67 Text Interpretation:  Sinus rhythm Nonspecific IVCD with LAD LVH with secondary repolarization abnormality Inferior infarct, old Probable lateral infarct, age indeterminate Anterior infarct, acute (LAD) Sinus rhythm ST-t wave abnormality Left ventricular hypertrophy Abnormal ekg on review, after observation of monitor, artefact may be occluding evidence of pacer activity Reconfirmed by Gerhard MunchLOCKWOOD, Karlei Waldo  MD [id number]) on 03/24/2015 2:38:54 PM       Similar rhythm, pacemaker activity on a cardiac monitor, rate 75, abnormal   Gerhard Munchobert Rokia Bosket, MD 03/24/15 1458

## 2015-03-24 NOTE — ED Notes (Signed)
Per EMS pt had bright red blood coming from rectum. EMS reports approx blood loss. Pt sts she was laying down and felt a lot of warm fluid and blood was pouring out. Pt is A&O pt is on coumadin . Pt has hx of anxiety, pacemaker, stroke with L side paralysis deficit. Pt c/o LUQ and LLQ pain since last night. Pt denies nausea and vomiting.

## 2015-03-25 ENCOUNTER — Other Ambulatory Visit: Payer: Self-pay | Admitting: Gastroenterology

## 2016-01-03 IMAGING — CR DG WRIST COMPLETE 3+V*L*
4 series · 4 of 4 positions shown · non-contrast
Comparison: None.

CLINICAL DATA: Fell.  Left wrist pain.

EXAM:
LEFT WRIST - COMPLETE 3+ VIEW

[view not recorded (1 of 4)]
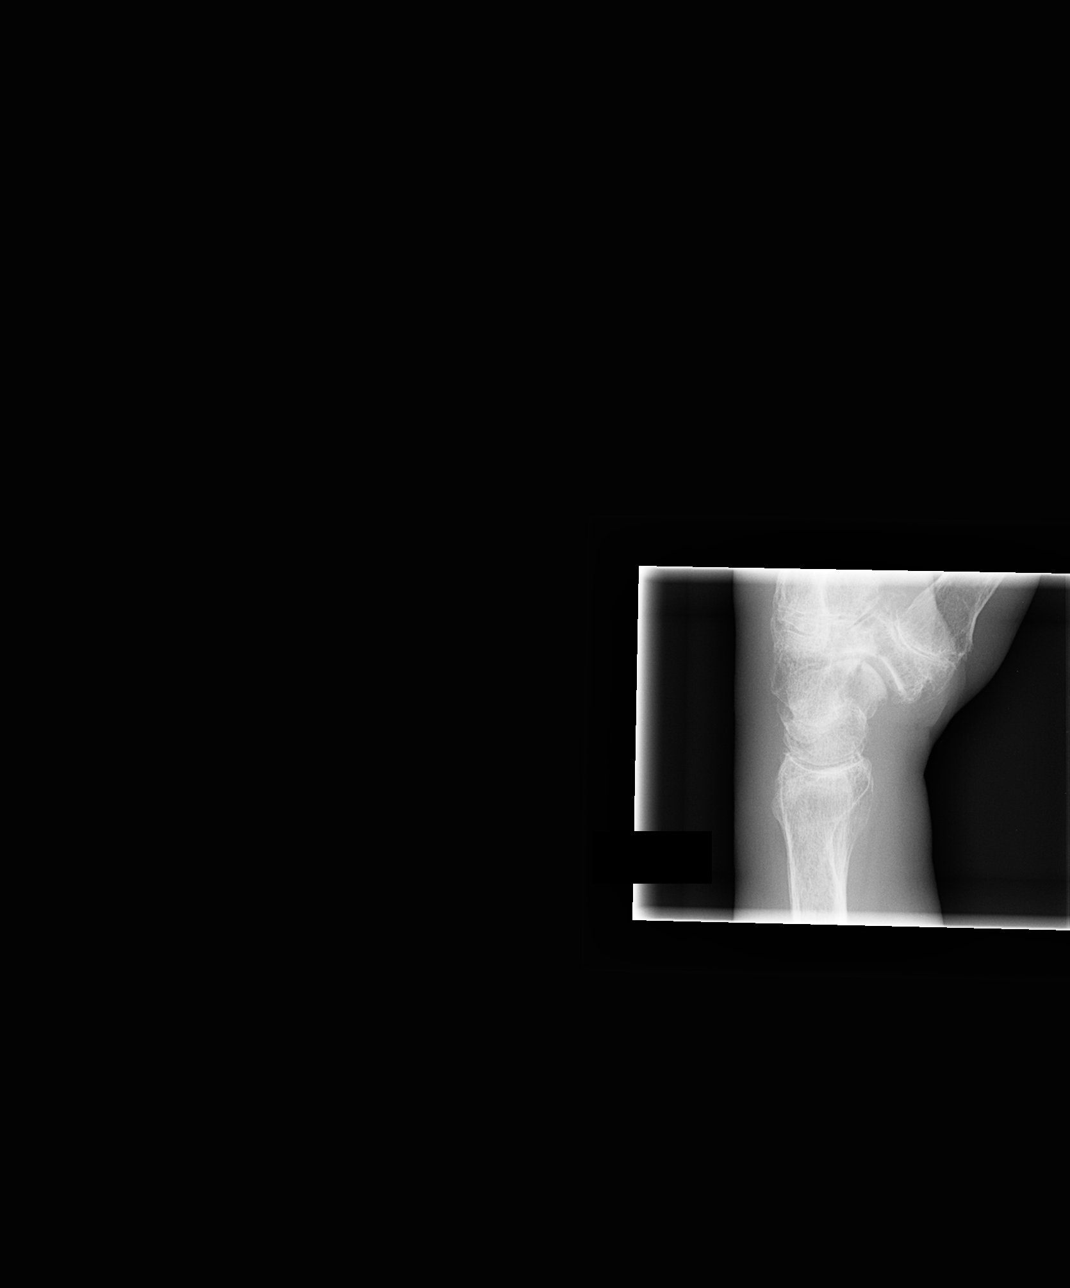

[view not recorded (2 of 4)]
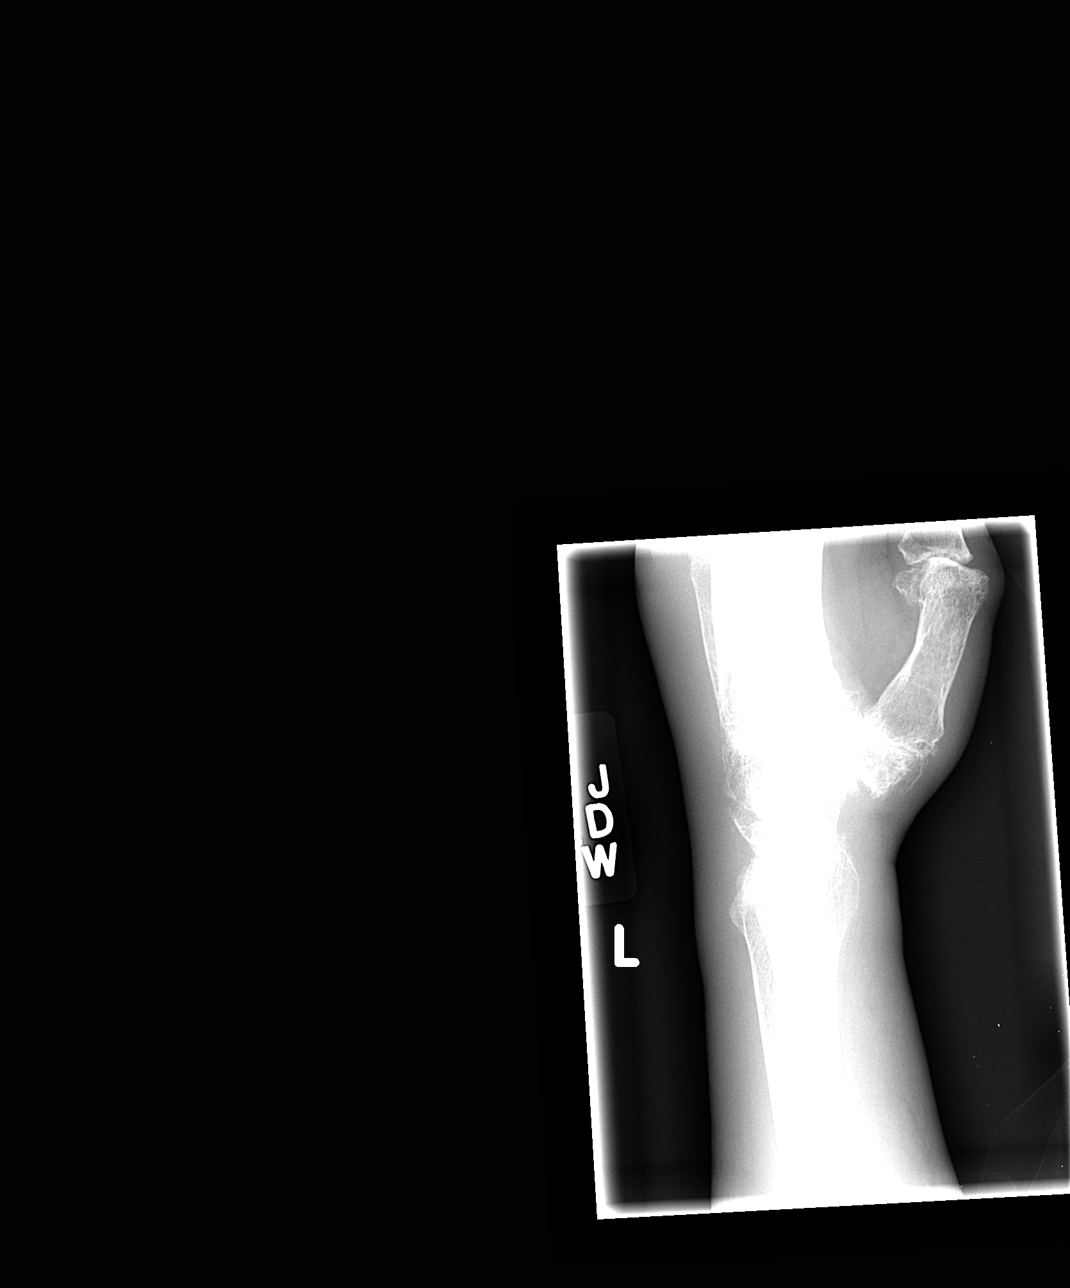

[view not recorded (3 of 4)]
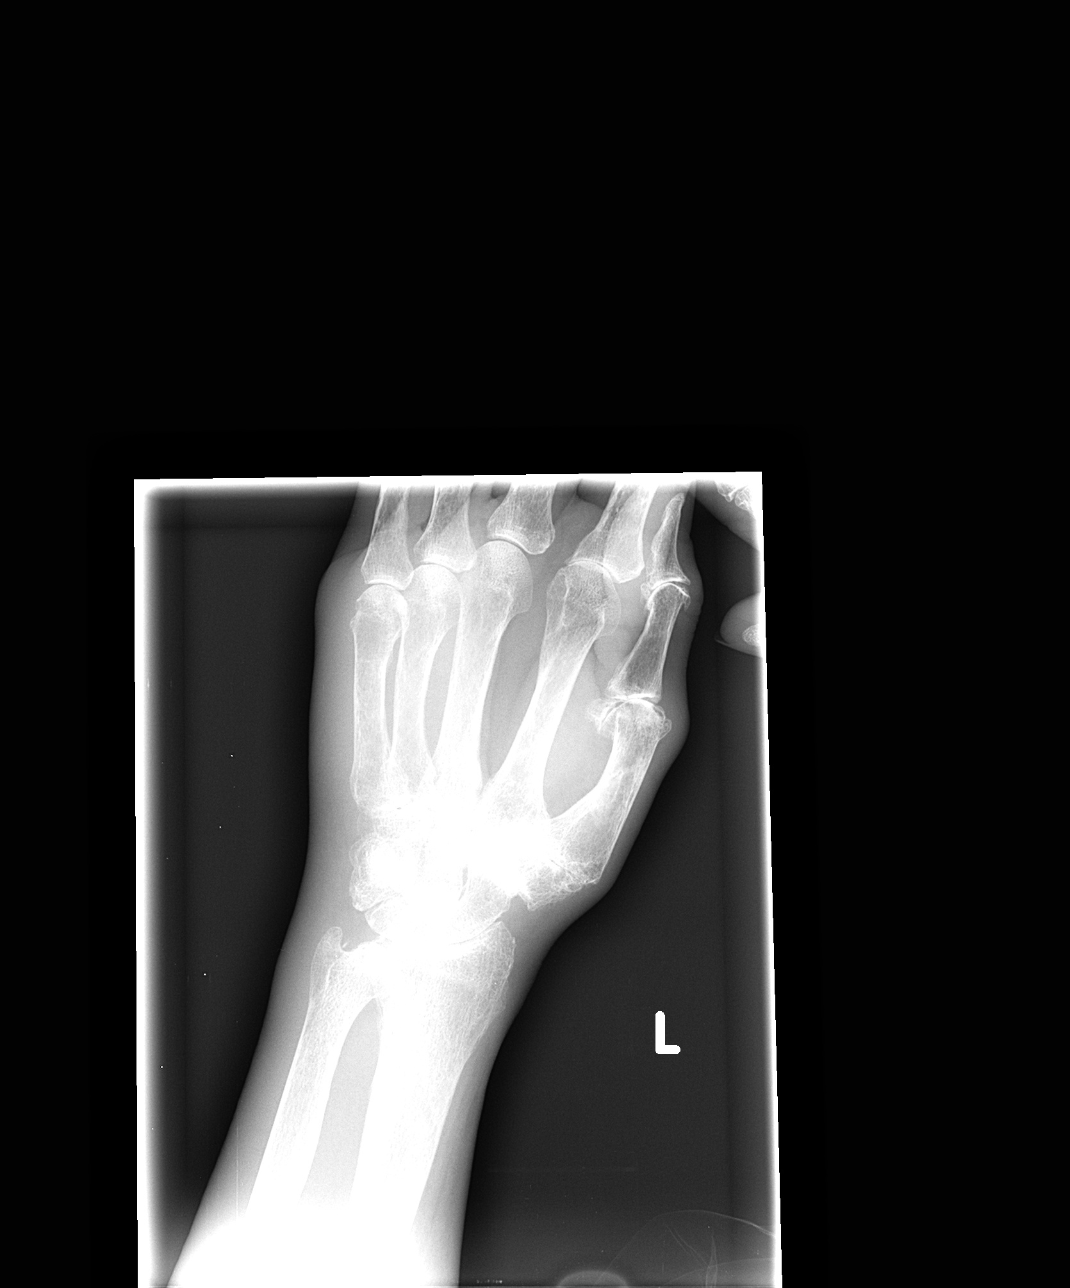

[view not recorded (4 of 4)]
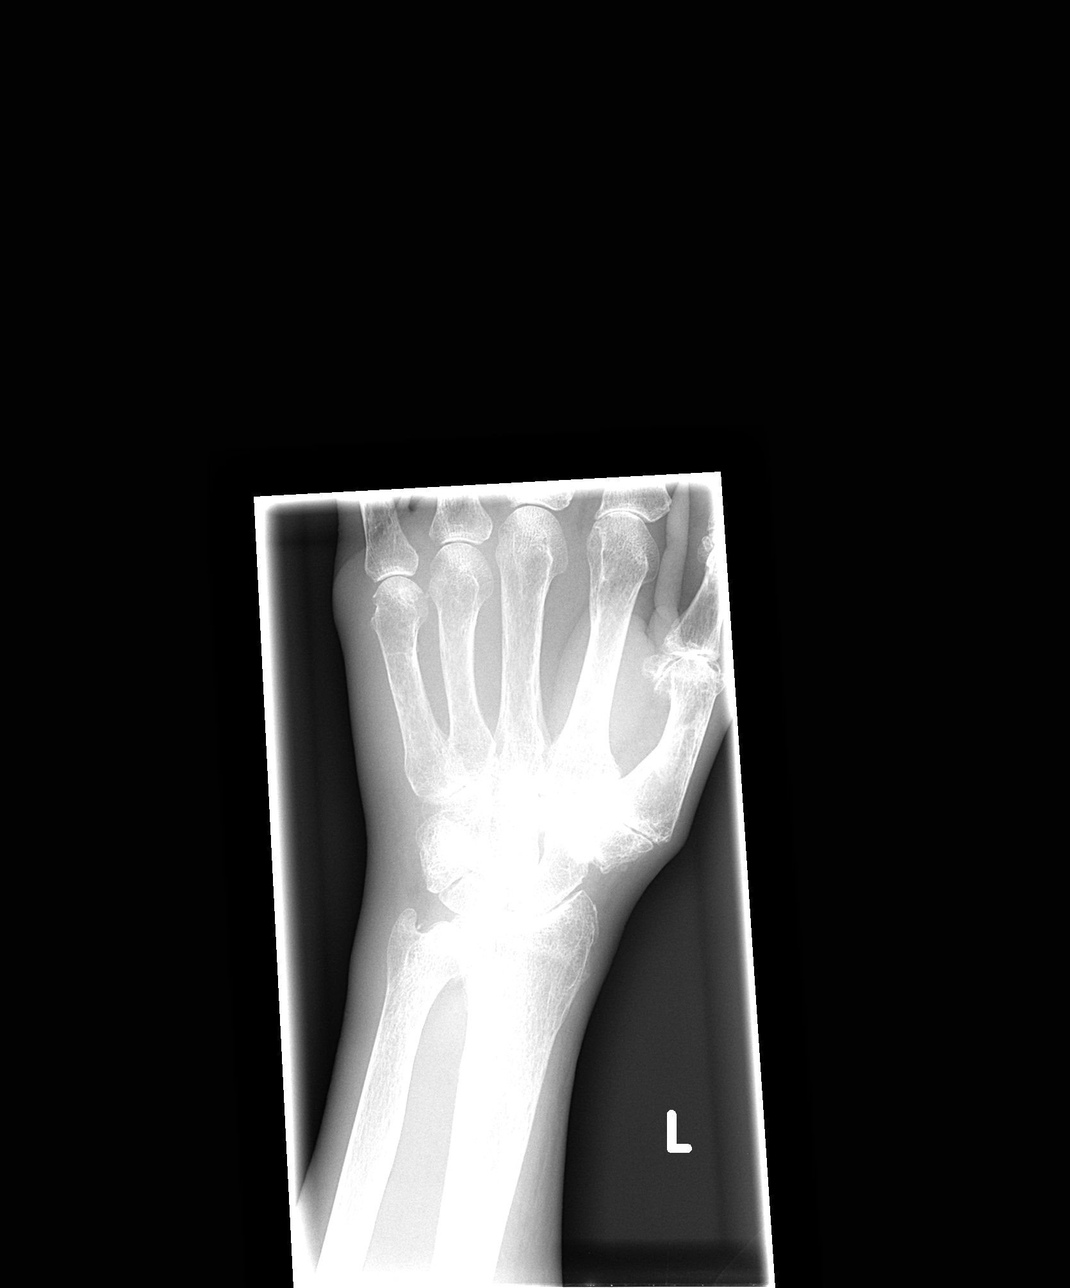

[4 of 4 positions shown; findings below may reference images not displayed]

FINDINGS: There are advanced degenerative changes involving the radial aspect
of the wrist. Radioulnar joint degenerative changes are also noted.
No acute fractures identified. Probable remote from involving the
second metacarpal.
IMPRESSION: Degenerative changes most notably involving the radial aspect of the
wrist.

No acute fracture.

## 2018-08-07 ENCOUNTER — Encounter: Payer: Self-pay | Admitting: Obstetrics & Gynecology

## 2018-08-07 ENCOUNTER — Ambulatory Visit: Payer: Medicare Other | Admitting: Obstetrics & Gynecology

## 2018-08-07 VITALS — BP 154/90 | Ht 61.75 in | Wt 115.0 lb

## 2018-08-07 DIAGNOSIS — R140 Abdominal distension (gaseous): Secondary | ICD-10-CM

## 2018-08-07 DIAGNOSIS — K648 Other hemorrhoids: Secondary | ICD-10-CM

## 2018-08-07 DIAGNOSIS — Z01419 Encounter for gynecological examination (general) (routine) without abnormal findings: Secondary | ICD-10-CM

## 2018-08-07 DIAGNOSIS — N950 Postmenopausal bleeding: Secondary | ICD-10-CM | POA: Diagnosis not present

## 2018-08-07 NOTE — Progress Notes (Signed)
Kimberly MorrisonHelen M Duesing 02/20/1929 [id number]   History:    84 y.o. G2P2L2   RP:  New patient presenting for annual gyn exam   HPI: Long-standing postmenopausal status.  No hormone replacement therapy.  Had very mild spotting with vaginal discharge for 1 day.  Could also have been coming from her hemorrhoids which bleed frequently.  Occasional bloatedness.  Also noticed small bumps on the vulva which are not painful and not draining.  Abstinent.  Prophylactic bilateral mastectomy with reconstruction.  Urine and bowel movements within normal.  Body mass index 21.20.  Health labs with family physician.  Past medical history,surgical history, family history and social history were all reviewed and documented in the EPIC chart.  Gynecologic History No LMP recorded. Patient is postmenopausal. Contraception: post menopausal status Last Pap: 5 yrs ago. Results were: normal Last mammogram: 09/2010. Results were: Negative Bone Density: 2 yrs ago Colonoscopy: 2011  Obstetric History OB History  Gravida Para Term Preterm AB Living  2 2       2   SAB TAB Ectopic Multiple Live Births               # Outcome Date GA Lbr Len/2nd Weight Sex Delivery Anes PTL Lv  2 Para           1 Para              ROS: A ROS was performed and pertinent positives and negatives are included in the history.  GENERAL: No fevers or chills. HEENT: No change in vision, no earache, sore throat or sinus congestion. NECK: No pain or stiffness. CARDIOVASCULAR: No chest pain or pressure. No palpitations. PULMONARY: No shortness of breath, cough or wheeze. GASTROINTESTINAL: No abdominal pain, nausea, vomiting or diarrhea, melena or bright red blood per rectum. GENITOURINARY: No urinary frequency, urgency, hesitancy or dysuria. MUSCULOSKELETAL: No joint or muscle pain, no back pain, no recent trauma. DERMATOLOGIC: No rash, no itching, no lesions. ENDOCRINE: No polyuria, polydipsia, no heat or cold intolerance. No recent change in  weight. HEMATOLOGICAL: No anemia or easy bruising or bleeding. NEUROLOGIC: No headache, seizures, numbness, tingling or weakness. PSYCHIATRIC: No depression, no loss of interest in normal activity or change in sleep pattern.     Exam:   BP (!) 154/90   Ht 5' 1.75" (1.568 m)   Wt 115 lb (52.2 kg)   BMI 21.20 kg/m   Body mass index is 21.2 kg/m.  General appearance : Well developed well nourished female. No acute distress HEENT: Eyes: no retinal hemorrhage or exudates,  Neck supple, trachea midline, no carotid bruits, no thyroidmegaly Lungs: Clear to auscultation, no rhonchi or wheezes, or rib retractions  Heart: Regular rate and rhythm, no murmurs or gallops Breast:Examined in sitting and supine position were symmetrical in appearance, no palpable masses or tenderness,  no skin retraction, no nipple inversion, no nipple discharge, no skin discoloration, no axillary or supraclavicular lymphadenopathy Abdomen: no palpable masses or tenderness, no rebound or guarding Extremities: no edema or skin discoloration or tenderness  Pelvic: Vulva: Normal             Vagina: No gross lesions or discharge  Cervix: No gross lesions or discharge  Uterus  AV, normal size, shape and consistency, non-tender and mobile  Adnexa  Without masses or tenderness  Anus: Prolapsed erythematous hemorrhoid.  Growth?   Assessment/Plan:  84 y.o. female for annual exam   1. Well female exam with routine gynecological exam Normal gynecologic exam in  menopause.  No indication to perform Pap test at this point.  Breast exam normal status post bilateral prophylactic mastectomy with reconstruction.  Health labs with family physician.  Not doing colonoscopy anymore.  2. Postmenopausal bleeding No blood in the vagina and no pathology seen on the cervix or vagina.  Bleeding probably from the prolapse inflamed hemorrhoid, but will further investigate the possibility of postmenopausal bleeding with a pelvic ultrasound to  evaluate the endometrial line. - US Transvaginal Non-OB; Future  3. Bloating Probably gastrointestinal in origin.  Will follow-up for pelvic ultrasound to rule out ovarian pathology. - US Transvaginal Non-OB; Future  4. Hemorrhoid prolapse Prolapsed, inflamed hemorrhoid.  Growth or only inflammation?  Refer to Colorectal surgeon.  Other orders - ALPRAZolam (XANAX) 0.25 MG tablet; Take 0.25 mg by mouth at bedtime as needed for anxiety. - cholecalciferol (VITAMIN D3) 25 MCG (1000 UT) tablet; Take 1,000 Units by mouth daily. - denosumab (PROLIA) 60 MG/ML SOSY injection; Inject 60 mg into the skin every 6 (six) months.  Counseling on above issues and coordination of care more than 50% for 10 minutes.  Genia Del MD, 2:43 PM 08/07/2018

## 2018-08-12 ENCOUNTER — Encounter: Payer: Self-pay | Admitting: Obstetrics & Gynecology

## 2018-08-12 ENCOUNTER — Telehealth: Payer: Self-pay | Admitting: *Deleted

## 2018-08-12 NOTE — Telephone Encounter (Signed)
-----   Message from Genia DelMarie-Lyne Lavoie, MD sent at 08/07/2018  3:04 PM EST ----- Regarding: Refer to Romie LeveeAlicia Thomas Prolapsed bleeding hemorrhoid.  Growth?  Dr Romie LeveeAlicia Thomas opinion/management.  Can call daughter Eunice BlaseDebbie to organize appointment at [phone] (cell) or [phone] (home).

## 2018-08-12 NOTE — Telephone Encounter (Signed)
Sent staff message to Maralyn SagoSarah Nurse, [facility] she will call to schedule.

## 2018-08-12 NOTE — Patient Instructions (Signed)
1. Well female exam with routine gynecological exam Normal gynecologic exam in menopause.  No indication to perform Pap test at this point.  Breast exam normal status post bilateral prophylactic mastectomy with reconstruction.  Health labs with family physician.  Not doing colonoscopy anymore.  2. Postmenopausal bleeding No blood in the vagina and no pathology seen on the cervix or vagina.  Bleeding probably from the prolapse inflamed hemorrhoid, but will further investigate the possibility of postmenopausal bleeding with a pelvic ultrasound to evaluate the endometrial line. - US Transvaginal Non-OB; Future  3. Bloating Probably gastrointestinal in origin.  Will follow-up for pelvic ultrasound to rule out ovarian pathology. - US Transvaginal Non-OB; Future  4. Hemorrhoid prolapse Prolapsed, inflamed hemorrhoid.  Growth or only inflammation?  Refer to Colorectal surgeon.  Other orders - ALPRAZolam (XANAX) 0.25 MG tablet; Take 0.25 mg by mouth at bedtime as needed for anxiety. - cholecalciferol (VITAMIN D3) 25 MCG (1000 UT) tablet; Take 1,000 Units by mouth daily. - denosumab (PROLIA) 60 MG/ML SOSY injection; Inject 60 mg into the skin every 6 (six) months.  Myriam JacobsonHelen, it was a pleasure meeting you today!

## 2018-08-18 NOTE — Telephone Encounter (Signed)
Patient scheduled on 08/25/18,at 1:40 Dr.Thomas

## 2018-09-15 ENCOUNTER — Encounter: Payer: Self-pay | Admitting: Obstetrics & Gynecology

## 2018-09-15 ENCOUNTER — Ambulatory Visit: Payer: Medicare Other | Admitting: Obstetrics & Gynecology

## 2018-09-15 ENCOUNTER — Ambulatory Visit (INDEPENDENT_AMBULATORY_CARE_PROVIDER_SITE_OTHER): Payer: Medicare Other

## 2018-09-15 DIAGNOSIS — R140 Abdominal distension (gaseous): Secondary | ICD-10-CM

## 2018-09-15 DIAGNOSIS — N950 Postmenopausal bleeding: Secondary | ICD-10-CM

## 2018-09-15 NOTE — Patient Instructions (Signed)
1. Postmenopausal bleeding Pelvic ultrasound findings reviewed with patient and daughter.  Endometrial lining is thin and normal at 3.7 mm.  Benign findings per ultrasound.  Patient reassured.  Follow-up annual gynecologic exam.  Kimberly JacobsonHelen, it was a pleasure seeing you today!

## 2018-09-15 NOTE — Progress Notes (Signed)
    Kimberly MorrisonHelen M Werner 10/02/1928 [id number]        84 y.o.  G2P2L2  RP: Spotting x 1 vaginally for Pelvic US  HPI: Spotting x 1 vaginally in 07/2018, no recurrence since then.  No pelvic pain.     OB History  Gravida Para Term Preterm AB Living  2 2       2   SAB TAB Ectopic Multiple Live Births               # Outcome Date GA Lbr Len/2nd Weight Sex Delivery Anes PTL Lv  2 Para           1 Para             Past medical history,surgical history, problem list, medications, allergies, family history and social history were all reviewed and documented in the EPIC chart.   Directed ROS with pertinent positives and negatives documented in the history of present illness/assessment and plan.  Exam:  There were no vitals filed for this visit. General appearance:  Normal  Pelvic US today: T/V images.  Anteverted uterus measuring 5.39 x 4.86 x 2.39 cm.  Intramural fibroid calcification measuring 1 cm.  Endometrial lining thin and normal at 3.7 mm.  Small amount of fluid in the endometrium measuring 7 x 3 mm.  Right ovary normal.  Left ovary with an echo-free cyst measuring 9 x 8 mm.  No free fluid in the posterior cul-de-sac.   Assessment/Plan:  84 y.o. G2P2   1. Postmenopausal bleeding Pelvic ultrasound findings reviewed with patient and daughter.  Endometrial lining is thin and normal at 3.7 mm.  Benign findings per ultrasound.  Patient reassured.  Follow-up annual gynecologic exam.  Counseling on above issues and coordination of care more than 50% for 15 minutes.  Kimberly DelMarie-Lyne Omarian Jaquith MD, 2:55 PM 09/15/2018

## 2018-11-03 ENCOUNTER — Other Ambulatory Visit (HOSPITAL_COMMUNITY): Payer: Self-pay

## 2018-11-03 DIAGNOSIS — R1310 Dysphagia, unspecified: Secondary | ICD-10-CM

## 2018-11-21 ENCOUNTER — Ambulatory Visit (HOSPITAL_COMMUNITY)
Admission: RE | Admit: 2018-11-21 | Discharge: 2018-11-21 | Disposition: A | Discharge status: Home / Self Care | Payer: Medicare Other | Source: Ambulatory Visit | Attending: Physician Assistant | Admitting: Physician Assistant

## 2018-11-21 DIAGNOSIS — R1310 Dysphagia, unspecified: Secondary | ICD-10-CM | POA: Diagnosis not present

## 2019-11-01 ENCOUNTER — Ambulatory Visit: Payer: Medicare Other | Attending: Internal Medicine

## 2019-11-01 DIAGNOSIS — Z23 Encounter for immunization: Secondary | ICD-10-CM | POA: Insufficient documentation

## 2019-11-01 NOTE — Progress Notes (Signed)
   Covid-19 Vaccination Clinic  Name:  SHARAINE DELANGE    MRN: [id number] DOB: September 27, 1928  11/01/2019  Ms. Leitz was observed post Covid-19 immunization for 15 minutes without incidence. She was provided with Vaccine Information Sheet and instruction to access the V-Safe system.   Ms. Samara was instructed to call 911 with any severe reactions post vaccine: Marland Kitchen Difficulty breathing  . Swelling of your face and throat  . A fast heartbeat  . A bad rash all over your body  . Dizziness and weakness    Immunizations Administered    Name Date Dose VIS Date Route   Pfizer COVID-19 Vaccine 11/01/2019 10:36 AM 0.3 mL 08/28/2019 Intramuscular   Manufacturer: [employer], [employer]   Lot: [id number]   NDC: [address]-9

## 2019-11-26 ENCOUNTER — Other Ambulatory Visit: Payer: Self-pay

## 2019-11-26 ENCOUNTER — Ambulatory Visit: Payer: Medicare Other

## 2019-11-26 ENCOUNTER — Ambulatory Visit: Payer: Medicare Other | Admitting: Podiatry

## 2019-11-26 DIAGNOSIS — M79672 Pain in left foot: Secondary | ICD-10-CM | POA: Diagnosis not present

## 2019-11-26 DIAGNOSIS — M2010 Hallux valgus (acquired), unspecified foot: Secondary | ICD-10-CM | POA: Diagnosis not present

## 2019-11-26 DIAGNOSIS — S90112A Contusion of left great toe without damage to nail, initial encounter: Secondary | ICD-10-CM | POA: Diagnosis not present

## 2019-11-26 DIAGNOSIS — M79671 Pain in right foot: Secondary | ICD-10-CM

## 2019-11-27 ENCOUNTER — Encounter: Payer: Self-pay | Admitting: Podiatry

## 2019-11-27 NOTE — Progress Notes (Signed)
Subjective:  Patient ID: Kimberly Werner, female    DOB: [birth year],  MRN: [id number]  Chief Complaint  Patient presents with  . Toe Pain    pt is here for left big toe pain, pain is at the top of the left big toe, pain has been going on for about 6 months, pain is elevated when she is walking on it.     86 y.o. female presents with the above complaint.  Patient presents with bunion as well as hammertoe contracture of the left big toe and second toe.  Patient states that this has been going on for 6 months.  There is a dark discoloration present on the nail with constant pain across the entire toenail.  The hallux itself is overlapping the second digit and is hurting her a lot.  Pain is gradual.  Patient is concerned that sometimes the toe also turns purple at times.  Pain is elevated when she is walking on it.  She denies any other acute complaints.  She does not have any pain today.   Review of Systems: Negative except as noted in the HPI. Denies N/V/F/Ch.  Past Medical History:  Diagnosis Date  . Anxiety   . COPD (chronic obstructive pulmonary disease) (HCC)   . Hyperlipidemia     Current Outpatient Medications:  .  ALPRAZolam (XANAX) 0.25 MG tablet, Take 0.25 mg by mouth at bedtime as needed for anxiety., Disp: , Rfl:  .  Ascorbic Acid (VITAMIN C) 100 MG tablet, Take 100 mg by mouth daily., Disp: , Rfl:  .  Calcium Carbonate-Vitamin D (CALCIUM 500 + D PO), Take 1 tablet by mouth daily., Disp: , Rfl:  .  cholecalciferol (VITAMIN D3) 25 MCG (1000 UT) tablet, Take 1,000 Units by mouth daily., Disp: , Rfl:  .  denosumab (PROLIA) 60 MG/ML SOSY injection, Inject 60 mg into the skin every 6 (six) months., Disp: , Rfl:  .  DULoxetine (CYMBALTA) 60 MG capsule, Take 60 mg by mouth daily., Disp: , Rfl:  .  ibuprofen (ADVIL,MOTRIN) 200 MG tablet, Take 200 mg by mouth every 6 (six) hours as needed (pain)., Disp: , Rfl:  .  loratadine (CLARITIN) 10 MG tablet, Take 10 mg by mouth daily., Disp: ,  Rfl:  .  Melatonin 2.5 MG CHEW, Chew by mouth., Disp: , Rfl:  .  Multiple Vitamin (MULTIVITAMIN WITH MINERALS) TABS, Take 1 tablet by mouth daily., Disp: , Rfl:   Social History   Tobacco Use  Smoking Status Former Smoker  . Quit date: 04/09/1953  . Years since quitting: 66.6  Smokeless Tobacco Never Used    No Known Allergies Objective:  There were no vitals filed for this visit. There is no height or weight on file to calculate BMI. Constitutional Well developed. Well nourished.  Vascular Dorsalis pedis pulses palpable bilaterally. Posterior tibial pulses palpable bilaterally. Capillary refill normal to all digits.  No cyanosis or clubbing noted. Pedal hair growth normal.  Neurologic Normal speech. Oriented to person, place, and time. Epicritic sensation to light touch grossly present bilaterally.  Dermatologic Nails well groomed and normal in appearance. No open wounds. No skin lesions.  Orthopedic:  Hallux valgus present on the left side with predislocation syndrome of the second digit.  The hallux is riding on the second toe.   Radiographs: 2 views of skeletally mature adult foot bilateral: Multiple hammertoe contractures noted with bunion deformity bilaterally.  There is second digit hammertoe contracture noted as well bilaterally. Assessment:   1.  Foot pain, bilateral   2. Contusion of left great toe without damage to nail, initial encounter   3. Acquired hallux valgus, unspecified laterality    Plan:  Patient was evaluated and treated and all questions answered.  Left hallux abductor valgus with associated second digit hammertoe contracture overriding -I explained to the patient the etiology of bunion deformity with elevation of the second digit.  I believe that patient is likely causing microtrauma to the dorsal aspect of the nail from constantly rubbing against the shoes.  I believe she will benefit from toe protector as well as wider shoe gear modification.   Currently the shoes that she is wearing seems to be rubbing right up against it and causing her pain especially when she is ambulating.  Patient states understanding will obtain wider shoe gear modifications. -Toe protector was dispensed   No follow-ups on file.

## 2019-12-01 ENCOUNTER — Ambulatory Visit: Payer: Medicare Other | Attending: Internal Medicine

## 2019-12-01 DIAGNOSIS — Z23 Encounter for immunization: Secondary | ICD-10-CM

## 2019-12-01 NOTE — Progress Notes (Signed)
   Covid-19 Vaccination Clinic  Name:  Kimberly Werner    MRN: [id number] DOB: 10-28-1928  12/01/2019  Kimberly Werner was observed post Covid-19 immunization for 15 minutes without incident. She was provided with Vaccine Information Sheet and instruction to access the V-Safe system.   Kimberly Werner was instructed to call 911 with any severe reactions post vaccine: Marland Kitchen Difficulty breathing  . Swelling of face and throat  . A fast heartbeat  . A bad rash all over body  . Dizziness and weakness   Immunizations Administered    Name Date Dose VIS Date Route   Pfizer COVID-19 Vaccine 12/01/2019  1:02 PM 0.3 mL 08/28/2019 Intramuscular   Manufacturer: [employer], [employer]   Lot: [id number]   NDC: [address]-6

## 2020-09-08 ENCOUNTER — Ambulatory Visit: Payer: Medicare Other | Admitting: Podiatry

## 2020-09-23 DIAGNOSIS — J449 Chronic obstructive pulmonary disease, unspecified: Secondary | ICD-10-CM | POA: Diagnosis not present

## 2020-09-23 DIAGNOSIS — I10 Essential (primary) hypertension: Secondary | ICD-10-CM | POA: Diagnosis not present

## 2020-09-28 ENCOUNTER — Other Ambulatory Visit: Payer: Self-pay

## 2020-09-28 ENCOUNTER — Ambulatory Visit: Payer: Medicare Other | Admitting: Podiatry

## 2020-09-28 ENCOUNTER — Encounter: Payer: Self-pay | Admitting: Podiatry

## 2020-09-28 DIAGNOSIS — M21612 Bunion of left foot: Secondary | ICD-10-CM | POA: Diagnosis not present

## 2020-09-28 DIAGNOSIS — K219 Gastro-esophageal reflux disease without esophagitis: Secondary | ICD-10-CM | POA: Insufficient documentation

## 2020-09-28 DIAGNOSIS — J449 Chronic obstructive pulmonary disease, unspecified: Secondary | ICD-10-CM | POA: Insufficient documentation

## 2020-09-28 DIAGNOSIS — L600 Ingrowing nail: Secondary | ICD-10-CM

## 2020-09-28 DIAGNOSIS — M2012 Hallux valgus (acquired), left foot: Secondary | ICD-10-CM

## 2020-09-28 DIAGNOSIS — K909 Intestinal malabsorption, unspecified: Secondary | ICD-10-CM | POA: Insufficient documentation

## 2020-09-28 DIAGNOSIS — B351 Tinea unguium: Secondary | ICD-10-CM

## 2020-09-28 DIAGNOSIS — M2042 Other hammer toe(s) (acquired), left foot: Secondary | ICD-10-CM | POA: Diagnosis not present

## 2020-09-28 DIAGNOSIS — F325 Major depressive disorder, single episode, in full remission: Secondary | ICD-10-CM | POA: Insufficient documentation

## 2020-09-28 DIAGNOSIS — I10 Essential (primary) hypertension: Secondary | ICD-10-CM | POA: Insufficient documentation

## 2020-09-28 DIAGNOSIS — M810 Age-related osteoporosis without current pathological fracture: Secondary | ICD-10-CM | POA: Insufficient documentation

## 2020-09-28 DIAGNOSIS — F411 Generalized anxiety disorder: Secondary | ICD-10-CM | POA: Insufficient documentation

## 2020-09-28 NOTE — Progress Notes (Signed)
  Subjective:  Patient ID: Kimberly Werner, female    DOB: 03-04-1929,  MRN: [id number]  Chief Complaint  Patient presents with  . Nail Problem    Patient presents today for painful left great toenail, thick, discolored, nail growing into top of toe causing pain x 3-4 months    87 y.o. female presents with the above complaint. History confirmed with patient.  She is here with her daughter today.  Previously seen Dr. Allena Katz for pain  Objective:  Physical Exam: Foot is warm and well-perfused, palpable pedal pulses, she has severe hallux valgus with overlapping over the second toe, the left hallux nail is thickened, elongated dystrophic and curved with yellow discoloration and subungual debris Assessment:   1. Onychomycosis   2. Ingrowing left great toenail      Plan:  Patient was evaluated and treated and all questions answered.  Discussed the etiology and treatment options for the condition in detail with the patient. Educated patient on the topical and oral treatment options for mycotic nails. Recommended debridement of the nails today. Sharp and mechanical debridement performed of all painful and mycotic nails today. Nails debrided in length and thickness using a nail nipper to level of comfort. Discussed treatment options including appropriate shoe gear. Follow up as needed for painful nails.  Discussed with patient and her daughter that the dystrophy and mycotic nail is likely exacerbated by the severe hallux valgus.  Recommend continue nonoperative treatment for this  Return if symptoms worsen or fail to improve.

## 2020-10-31 DIAGNOSIS — R350 Frequency of micturition: Secondary | ICD-10-CM | POA: Diagnosis not present

## 2020-10-31 DIAGNOSIS — N3000 Acute cystitis without hematuria: Secondary | ICD-10-CM | POA: Diagnosis not present

## 2020-10-31 DIAGNOSIS — R300 Dysuria: Secondary | ICD-10-CM | POA: Diagnosis not present

## 2020-11-09 DIAGNOSIS — I10 Essential (primary) hypertension: Secondary | ICD-10-CM | POA: Diagnosis not present

## 2020-11-09 DIAGNOSIS — E7800 Pure hypercholesterolemia, unspecified: Secondary | ICD-10-CM | POA: Diagnosis not present

## 2020-11-09 DIAGNOSIS — J449 Chronic obstructive pulmonary disease, unspecified: Secondary | ICD-10-CM | POA: Diagnosis not present

## 2020-11-09 DIAGNOSIS — K219 Gastro-esophageal reflux disease without esophagitis: Secondary | ICD-10-CM | POA: Diagnosis not present

## 2020-11-09 DIAGNOSIS — M810 Age-related osteoporosis without current pathological fracture: Secondary | ICD-10-CM | POA: Diagnosis not present

## 2020-11-14 DIAGNOSIS — E559 Vitamin D deficiency, unspecified: Secondary | ICD-10-CM | POA: Diagnosis not present

## 2020-11-14 DIAGNOSIS — R5383 Other fatigue: Secondary | ICD-10-CM | POA: Diagnosis not present

## 2020-11-14 DIAGNOSIS — M810 Age-related osteoporosis without current pathological fracture: Secondary | ICD-10-CM | POA: Diagnosis not present

## 2020-11-16 DIAGNOSIS — E559 Vitamin D deficiency, unspecified: Secondary | ICD-10-CM | POA: Diagnosis not present

## 2020-11-16 DIAGNOSIS — M810 Age-related osteoporosis without current pathological fracture: Secondary | ICD-10-CM | POA: Diagnosis not present

## 2021-01-03 DIAGNOSIS — E7800 Pure hypercholesterolemia, unspecified: Secondary | ICD-10-CM | POA: Diagnosis not present

## 2021-01-03 DIAGNOSIS — M810 Age-related osteoporosis without current pathological fracture: Secondary | ICD-10-CM | POA: Diagnosis not present

## 2021-01-03 DIAGNOSIS — K219 Gastro-esophageal reflux disease without esophagitis: Secondary | ICD-10-CM | POA: Diagnosis not present

## 2021-01-03 DIAGNOSIS — I10 Essential (primary) hypertension: Secondary | ICD-10-CM | POA: Diagnosis not present

## 2021-01-03 DIAGNOSIS — J449 Chronic obstructive pulmonary disease, unspecified: Secondary | ICD-10-CM | POA: Diagnosis not present

## 2021-01-04 DIAGNOSIS — R110 Nausea: Secondary | ICD-10-CM | POA: Diagnosis not present

## 2021-01-04 DIAGNOSIS — I10 Essential (primary) hypertension: Secondary | ICD-10-CM | POA: Diagnosis not present

## 2021-01-07 DIAGNOSIS — H1011 Acute atopic conjunctivitis, right eye: Secondary | ICD-10-CM | POA: Diagnosis not present

## 2021-01-18 DIAGNOSIS — I10 Essential (primary) hypertension: Secondary | ICD-10-CM | POA: Diagnosis not present

## 2021-01-23 DIAGNOSIS — M810 Age-related osteoporosis without current pathological fracture: Secondary | ICD-10-CM | POA: Diagnosis not present

## 2021-01-23 DIAGNOSIS — E7800 Pure hypercholesterolemia, unspecified: Secondary | ICD-10-CM | POA: Diagnosis not present

## 2021-01-23 DIAGNOSIS — J449 Chronic obstructive pulmonary disease, unspecified: Secondary | ICD-10-CM | POA: Diagnosis not present

## 2021-01-23 DIAGNOSIS — I10 Essential (primary) hypertension: Secondary | ICD-10-CM | POA: Diagnosis not present

## 2021-01-23 DIAGNOSIS — K219 Gastro-esophageal reflux disease without esophagitis: Secondary | ICD-10-CM | POA: Diagnosis not present

## 2021-03-10 DIAGNOSIS — N390 Urinary tract infection, site not specified: Secondary | ICD-10-CM | POA: Diagnosis not present

## 2021-03-10 DIAGNOSIS — R300 Dysuria: Secondary | ICD-10-CM | POA: Diagnosis not present

## 2021-03-30 DIAGNOSIS — M810 Age-related osteoporosis without current pathological fracture: Secondary | ICD-10-CM | POA: Diagnosis not present

## 2021-03-30 DIAGNOSIS — J449 Chronic obstructive pulmonary disease, unspecified: Secondary | ICD-10-CM | POA: Diagnosis not present

## 2021-03-30 DIAGNOSIS — E7800 Pure hypercholesterolemia, unspecified: Secondary | ICD-10-CM | POA: Diagnosis not present

## 2021-03-30 DIAGNOSIS — K219 Gastro-esophageal reflux disease without esophagitis: Secondary | ICD-10-CM | POA: Diagnosis not present

## 2021-03-30 DIAGNOSIS — I10 Essential (primary) hypertension: Secondary | ICD-10-CM | POA: Diagnosis not present

## 2021-05-16 DIAGNOSIS — H33052 Total retinal detachment, left eye: Secondary | ICD-10-CM | POA: Diagnosis not present

## 2021-05-16 DIAGNOSIS — H43392 Other vitreous opacities, left eye: Secondary | ICD-10-CM | POA: Diagnosis not present

## 2021-05-16 DIAGNOSIS — Z961 Presence of intraocular lens: Secondary | ICD-10-CM | POA: Diagnosis not present

## 2021-05-16 DIAGNOSIS — H04123 Dry eye syndrome of bilateral lacrimal glands: Secondary | ICD-10-CM | POA: Diagnosis not present

## 2021-06-26 DIAGNOSIS — M810 Age-related osteoporosis without current pathological fracture: Secondary | ICD-10-CM | POA: Diagnosis not present

## 2021-06-26 DIAGNOSIS — R5383 Other fatigue: Secondary | ICD-10-CM | POA: Diagnosis not present

## 2021-06-26 DIAGNOSIS — E559 Vitamin D deficiency, unspecified: Secondary | ICD-10-CM | POA: Diagnosis not present

## 2021-06-27 DIAGNOSIS — M810 Age-related osteoporosis without current pathological fracture: Secondary | ICD-10-CM | POA: Diagnosis not present

## 2021-06-27 DIAGNOSIS — E559 Vitamin D deficiency, unspecified: Secondary | ICD-10-CM | POA: Diagnosis not present

## 2021-07-20 ENCOUNTER — Other Ambulatory Visit: Payer: Self-pay

## 2021-07-20 ENCOUNTER — Encounter: Payer: Self-pay | Admitting: Obstetrics & Gynecology

## 2021-07-20 ENCOUNTER — Ambulatory Visit: Payer: Medicare Other | Admitting: Obstetrics & Gynecology

## 2021-07-20 VITALS — BP 124/80 | HR 78 | Resp 16

## 2021-07-20 DIAGNOSIS — N762 Acute vulvitis: Secondary | ICD-10-CM

## 2021-07-20 DIAGNOSIS — R140 Abdominal distension (gaseous): Secondary | ICD-10-CM

## 2021-07-20 DIAGNOSIS — N9089 Other specified noninflammatory disorders of vulva and perineum: Secondary | ICD-10-CM

## 2021-07-20 LAB — WET PREP FOR TRICH, YEAST, CLUE

## 2021-07-20 MED ORDER — NYSTATIN-TRIAMCINOLONE 100000-0.1 UNIT/GM-% EX OINT: 1.0000 "application " | TOPICAL_OINTMENT | Freq: Every day | CUTANEOUS | 4 refills | Status: DC

## 2021-07-20 MED ORDER — CLOBETASOL PROPIONATE 0.05 % EX OINT: 1.0000 "application " | TOPICAL_OINTMENT | Freq: Every day | CUTANEOUS | 0 refills | Status: AC

## 2021-07-20 NOTE — Progress Notes (Signed)
    Kimberly Werner 1929-04-23 [id number]        87 y.o.  G2P2L2  Accompanied by her daughter [name].   RP: C/O soreness at the vulva x about a week  HPI: C/O a soreness at the vulva x about a week.  No change in pads/soap, clothes.  Some urinary incontinence. Intermittent bloating.    OB History  Gravida Para Term Preterm AB Living  2 2       2   SAB IAB Ectopic Multiple Live Births               # Outcome Date GA Lbr Len/2nd Weight Sex Delivery Anes PTL Lv  2 Para           1 Para             Past medical history,surgical history, problem list, medications, allergies, family history and social history were all reviewed and documented in the EPIC chart.   Directed ROS with pertinent positives and negatives documented in the history of present illness/assessment and plan.  Exam:  Vitals:   07/20/21 1636  BP: 124/80  Pulse: 78  Resp: 16   General appearance:  Normal  Abdomen: Soft, NT.    Gynecologic exam: Vulva with erythema.  No discrete lesion seen.  Wet prep done with a small swab vaginally.  Wet prep Neg   Assessment/Plan:  87 y.o. G2P2   1. Vulvar irritation Wet prep Neg.  Reassured. - WET PREP FOR TRICH, YEAST, CLUE  2. Acute vulvitis Probably d/t irritation from urine.  Counseling done.  Decision to treat with Clobetasol ointment HS x 2 weeks.  Will then use Kenalog as needed.  Usage reviewed and prescriptions sent to pharmacy.  3. Bloating symptom Recommend changes in nutrition according to Sxs.  Increase ambulation as much as possible.  Other orders - clobetasol ointment (TEMOVATE) 0.05 %; Apply 1 application topically at bedtime for 14 days. - nystatin-triamcinolone ointment (MYCOLOG); Apply 1 application topically at bedtime. Use as needed for mild vulvar irritation.    95 MD, 5:19 PM 07/20/2021

## 2021-07-21 ENCOUNTER — Other Ambulatory Visit: Payer: Self-pay | Admitting: Obstetrics & Gynecology

## 2021-07-21 ENCOUNTER — Telehealth: Payer: Self-pay | Admitting: *Deleted

## 2021-07-21 MED ORDER — NYSTATIN 100000 UNIT/GM EX OINT: TOPICAL_OINTMENT | CUTANEOUS | 4 refills | Status: AC

## 2021-07-21 MED ORDER — TRIAMCINOLONE ACETONIDE 0.5 % EX OINT: TOPICAL_OINTMENT | CUTANEOUS | 4 refills | Status: AC

## 2021-07-21 NOTE — Telephone Encounter (Signed)
[facility] called stating the nystatin/triamcinolone ointment is not covered. If medication is sent separate it will be cheaper/covered for patient. New Rx sent for separate medications .  Dr.Lavoie just wanted to let you know I sent in this in for patient. Just FYI

## 2021-07-30 ENCOUNTER — Encounter: Payer: Self-pay | Admitting: Obstetrics & Gynecology

## 2021-09-12 DIAGNOSIS — R059 Cough, unspecified: Secondary | ICD-10-CM | POA: Diagnosis not present

## 2021-09-12 DIAGNOSIS — Z743 Need for continuous supervision: Secondary | ICD-10-CM | POA: Diagnosis not present

## 2021-09-13 ENCOUNTER — Emergency Department (HOSPITAL_COMMUNITY): Payer: Medicare Other

## 2021-09-13 ENCOUNTER — Emergency Department (HOSPITAL_COMMUNITY)
Admission: EM | Admit: 2021-09-13 | Discharge: 2021-09-13 | Disposition: A | Discharge status: Home / Self Care | Payer: Medicare Other

## 2021-09-13 ENCOUNTER — Emergency Department (HOSPITAL_COMMUNITY)
Admission: EM | Admit: 2021-09-13 | Discharge: 2021-09-14 | Disposition: A | Discharge status: Home / Self Care | Payer: Medicare Other | Attending: Emergency Medicine | Admitting: Emergency Medicine

## 2021-09-13 ENCOUNTER — Other Ambulatory Visit: Payer: Self-pay

## 2021-09-13 DIAGNOSIS — Z87891 Personal history of nicotine dependence: Secondary | ICD-10-CM | POA: Diagnosis not present

## 2021-09-13 DIAGNOSIS — Z7982 Long term (current) use of aspirin: Secondary | ICD-10-CM | POA: Insufficient documentation

## 2021-09-13 DIAGNOSIS — I10 Essential (primary) hypertension: Secondary | ICD-10-CM | POA: Insufficient documentation

## 2021-09-13 DIAGNOSIS — Z79899 Other long term (current) drug therapy: Secondary | ICD-10-CM | POA: Insufficient documentation

## 2021-09-13 DIAGNOSIS — J181 Lobar pneumonia, unspecified organism: Secondary | ICD-10-CM | POA: Diagnosis not present

## 2021-09-13 DIAGNOSIS — J439 Emphysema, unspecified: Secondary | ICD-10-CM | POA: Diagnosis not present

## 2021-09-13 DIAGNOSIS — R059 Cough, unspecified: Secondary | ICD-10-CM | POA: Diagnosis not present

## 2021-09-13 DIAGNOSIS — Z743 Need for continuous supervision: Secondary | ICD-10-CM | POA: Diagnosis not present

## 2021-09-13 DIAGNOSIS — Z20822 Contact with and (suspected) exposure to covid-19: Secondary | ICD-10-CM | POA: Diagnosis not present

## 2021-09-13 DIAGNOSIS — R0902 Hypoxemia: Secondary | ICD-10-CM | POA: Diagnosis not present

## 2021-09-13 DIAGNOSIS — R0602 Shortness of breath: Secondary | ICD-10-CM | POA: Diagnosis not present

## 2021-09-13 DIAGNOSIS — J449 Chronic obstructive pulmonary disease, unspecified: Secondary | ICD-10-CM | POA: Insufficient documentation

## 2021-09-13 DIAGNOSIS — J189 Pneumonia, unspecified organism: Secondary | ICD-10-CM

## 2021-09-13 LAB — COMPREHENSIVE METABOLIC PANEL
ALT: 16 U/L (ref 0–44)
AST: 17 U/L (ref 15–41)
Albumin: 3.2 g/dL — ABNORMAL LOW (ref 3.5–5.0)
Alkaline Phosphatase: 42 U/L (ref 38–126)
Anion gap: 8 (ref 5–15)
BUN: 15 mg/dL (ref 8–23)
CO2: 27 mmol/L (ref 22–32)
Calcium: 9.6 mg/dL (ref 8.9–10.3)
Chloride: 103 mmol/L (ref 98–111)
Creatinine, Ser: 0.62 mg/dL (ref 0.44–1.00)
GFR, Estimated: >60 mL/min (ref >60)
Glucose, Bld: 119 mg/dL — ABNORMAL HIGH (ref 70–99)
Potassium: 3.3 mmol/L — ABNORMAL LOW (ref 3.5–5.1)
Sodium: 138 mmol/L (ref 135–145)
Total Bilirubin: 0.9 mg/dL (ref 0.3–1.2)
Total Protein: 6.6 g/dL (ref 6.5–8.1)

## 2021-09-13 LAB — CBC WITH DIFFERENTIAL/PLATELET
Abs Immature Granulocytes: 0.05 10*3/uL (ref 0.00–0.07)
Basophils Absolute: 0.1 10*3/uL (ref 0.0–0.1)
Basophils Relative: 1 %
Eosinophils Absolute: 0.3 10*3/uL (ref 0.0–0.5)
Eosinophils Relative: 3 %
HCT: 39.1 % (ref 36.0–46.0)
Hemoglobin: 12.9 g/dL (ref 12.0–15.0)
Immature Granulocytes: 0 %
Lymphocytes Relative: 12 %
Lymphs Abs: 1.4 10*3/uL (ref 0.7–4.0)
MCH: 31.8 pg (ref 26.0–34.0)
MCHC: 33 g/dL (ref 30.0–36.0)
MCV: 96.3 fL (ref 80.0–100.0)
Monocytes Absolute: 1.4 10*3/uL — ABNORMAL HIGH (ref 0.1–1.0)
Monocytes Relative: 12 %
Neutro Abs: 8.8 10*3/uL — ABNORMAL HIGH (ref 1.7–7.7)
Neutrophils Relative %: 72 %
Platelets: 395 10*3/uL (ref 150–400)
RBC: 4.06 MIL/uL (ref 3.87–5.11)
RDW: 12.4 % (ref 11.5–15.5)
WBC: 12.1 10*3/uL — ABNORMAL HIGH (ref 4.0–10.5)
nRBC: 0 % (ref 0.0–0.2)

## 2021-09-13 LAB — RESP PANEL BY RT-PCR (FLU A&B, COVID) ARPGX2
Influenza A by PCR: NEGATIVE
Influenza B by PCR: NEGATIVE
SARS Coronavirus 2 by RT PCR: NEGATIVE

## 2021-09-13 MED ORDER — ALBUTEROL SULFATE HFA 108 (90 BASE) MCG/ACT IN AERS
4.0000 | INHALATION_SPRAY | Freq: Once | RESPIRATORY_TRACT | Status: AC
  Administered 2021-09-13: 21:00:00 4 via RESPIRATORY_TRACT
  Filled 2021-09-13: qty 6.7

## 2021-09-13 MED ORDER — AEROCHAMBER PLUS FLO-VU LARGE MISC: 1.0000 | Freq: Once | Status: DC

## 2021-09-13 NOTE — ED Triage Notes (Addendum)
Charted in error.

## 2021-09-13 NOTE — ED Triage Notes (Signed)
Pt here from home via [facility] for generalized illness x5 days, productive cough w/ green mucus and low-grade fever/ 155/68, 76HR, 17RR, 97% RA, CBG 130, 74F

## 2021-09-13 NOTE — ED Provider Notes (Signed)
Emergency Medicine Provider Triage Evaluation Note  [name] , a 87 y.o. female  was evaluated in triage.  Pt complains of confusion for the last few days as well as progressively worsening cough for the last 5 days, weakness, poor appetite.  Accompanied by her daughters at the bedside that she has been talking to and about people who have been deceased today which is new for her.  Additionally disoriented to place.  Today she states that she is at [facility].  Review of Systems  Positive: Cough, productive with green phlegm, subjective fever, fatigue, anorexia Negative: Chest pain, syncope, nausea, vomiting, diarrhea  Physical Exam  BP (!) 134/59 (BP Location: Right Arm)    Pulse 73    Temp 98.9 F (37.2 C) (Oral)    Resp 18    SpO2 97%  Gen:   Awake, no distress   Resp:  Normal effort  MSK:   Moves extremities without difficulty  Other:  Wheezing throughout lung fields bilaterally, A&O x2.  Abdomen soft, nondistended, nontender.  Medical Decision Making  Medically screening exam initiated at 9:03 PM.  Appropriate orders placed.  Cailie Bosshart Tubbs was informed that the remainder of the evaluation will be completed by another provider, this initial triage assessment does not replace that evaluation, and the importance of remaining in the ED until their evaluation is complete.  Suspect acute viral illness with likely COPD exacerbation given decreased air movement and wheezing throughout the lung fields bilaterally.  Will place labs and order albuterol.  This chart was dictated using voice recognition software, Dragon. Despite the best efforts of this provider to proofread and correct errors, errors may still occur which can change documentation meaning.    Paris Lore, PA-C 09/13/21 2106    Sloan Leiter, DO 09/14/21 [phone]

## 2021-09-14 LAB — URINALYSIS, ROUTINE W REFLEX MICROSCOPIC
Bilirubin Urine: NEGATIVE
Glucose, UA: NEGATIVE mg/dL
Hgb urine dipstick: NEGATIVE
Ketones, ur: NEGATIVE mg/dL
Nitrite: NEGATIVE
Protein, ur: 30 mg/dL — AB
Specific Gravity, Urine: 1.024 (ref 1.005–1.030)
pH: 5 (ref 5.0–8.0)

## 2021-09-14 MED ORDER — BENZONATATE 100 MG PO CAPS
100.0000 mg | ORAL_CAPSULE | Freq: Once | ORAL | Status: AC
  Administered 2021-09-14: 06:00:00 100 mg via ORAL
  Filled 2021-09-14: qty 1

## 2021-09-14 MED ORDER — BENZONATATE 100 MG PO CAPS: 100.0000 mg | ORAL_CAPSULE | Freq: Three times a day (TID) | ORAL | 0 refills | Status: AC | PRN

## 2021-09-14 MED ORDER — AMOXICILLIN-POT CLAVULANATE 400-57 MG/5ML PO SUSR: 800.0000 mg | Freq: Three times a day (TID) | ORAL | 0 refills | Status: AC

## 2021-09-14 MED ORDER — AMOXICILLIN-POT CLAVULANATE 400-57 MG/5ML PO SUSR
875.0000 mg | Freq: Once | ORAL | Status: AC
  Administered 2021-09-14: 07:00:00 875 mg via ORAL
  Filled 2021-09-14: qty 10.9

## 2021-09-14 NOTE — ED Provider Notes (Signed)
[facility] EMERGENCY DEPARTMENT Provider Note   CSN: [id number] Arrival date & time: 09/13/21  1531     History No chief complaint on file.   Kimberly Werner is a 87 y.o. female.   Cough Cough characteristics:  Productive Sputum characteristics:  Green Severity:  Moderate Onset quality:  Gradual Duration:  6 days Timing:  Constant Progression:  Unchanged Chronicity:  New Smoker: no   Relieved by:  Nothing Ineffective treatments:  Decongestant, rest and beta-agonist inhaler Associated symptoms: fever, shortness of breath, sore throat and wheezing   Associated symptoms: no chest pain and no myalgias       Past Medical History:  Diagnosis Date   Anxiety    COPD (chronic obstructive pulmonary disease) (HCC)    Hyperlipidemia     Patient Active Problem List   Diagnosis Date Noted   Age-related osteoporosis without current pathological fracture 09/28/2020   Chronic obstructive pulmonary disease, unspecified (HCC) 09/28/2020   Essential hypertension 09/28/2020   Gastro-esophageal reflux disease without esophagitis 09/28/2020   Generalized anxiety disorder 09/28/2020   Intestinal malabsorption 09/28/2020   Major depressive disorder, single episode, in full remission (HCC) 09/28/2020   Malnutrition of moderate degree (HCC) 09/23/2014   Difficulty speaking    Chest tightness or pressure 09/22/2014   Obstructive chronic bronchitis without exacerbation (HCC) 09/22/2014   Dysarthria 09/22/2014   Chest tightness 09/22/2014   Hyperlipidemia 04/09/2013   Palpitations 04/09/2013    Past Surgical History:  Procedure Laterality Date   EYE SURGERY     eyes surgery  83yrs ago   masectomy  57yrs   double     OB History     Gravida  2   Para  2   Term      Preterm      AB      Living  2      SAB      IAB      Ectopic      Multiple      Live Births              Family History  Problem Relation Age of Onset   Cancer Mother         breast   COPD Father    Breast cancer Sister    Diabetes Sister     Social History   Tobacco Use   Smoking status: Former    Types: Cigarettes    Quit date: 04/09/1953    Years since quitting: 68.4   Smokeless tobacco: Never   Tobacco comments:    Early 20's but not much  Vaping Use   Vaping Use: Never used  Substance Use Topics   Alcohol use: No   Drug use: No    Home Medications Prior to Admission medications   Medication Sig Start Date End Date Taking? Authorizing Provider  acetaminophen (TYLENOL) 500 MG tablet Take 500 mg by mouth every 6 (six) hours as needed for mild pain.   Yes [provider]  ALPRAZolam (XANAX) 0.25 MG tablet Take 0.25 mg by mouth at bedtime as needed for anxiety.   Yes [provider]  amLODipine (NORVASC) 2.5 MG tablet Take 2.5 mg by mouth at bedtime.   Yes [provider]  amoxicillin-clavulanate (AUGMENTIN) 400-57 MG/5ML suspension Take 10 mLs (800 mg total) by mouth 3 (three) times daily for 7 days. 09/14/21 09/21/21 Yes Toben Acuna, Barbara Cower, MD  aspirin 325 MG tablet Take 325 mg by mouth daily.  Yes [provider]  benzonatate (TESSALON) 100 MG capsule Take 1 capsule (100 mg total) by mouth 3 (three) times daily as needed for cough. 09/14/21  Yes Lakeesha Fontanilla, Barbara Cower, MD  Calcium Carbonate-Vitamin D (CALCIUM 500 + D PO) Take 1 tablet by mouth daily.   Yes [provider]  cholecalciferol (VITAMIN D3) 25 MCG (1000 UT) tablet Take 1,000 Units by mouth daily.   Yes [provider]  DULoxetine (CYMBALTA) 60 MG capsule Take 60 mg by mouth daily.   Yes [provider]  famotidine (PEPCID) 20 MG tablet 1 tablet   Yes [provider]  guaiFENesin (MUCINEX) 600 MG 12 hr tablet Take 600 mg by mouth daily as needed for cough.   Yes [provider]  ibuprofen (ADVIL,MOTRIN) 200 MG tablet Take 200 mg by mouth every 6 (six) hours as needed (pain).   Yes [provider]  loratadine  (CLARITIN) 10 MG tablet Take 10 mg by mouth daily.   Yes [provider]  Multiple Vitamin (MULTIVITAMIN WITH MINERALS) TABS Take 1 tablet by mouth daily.   Yes [provider]  nystatin ointment (MYCOSTATIN) Apply 1 application topically at bedtime. Use as needed for mild vulvar irritation 07/21/21  Yes Genia Del, MD  omeprazole (PRILOSEC) 20 MG capsule 1 capsule 30 minutes before morning meal   Yes [provider]  triamcinolone ointment (KENALOG) 0.5 % Apply 1 application topically at bedtime. Use as needed for mild vulvar irritation 07/21/21  Yes Genia Del, MD  denosumab (PROLIA) 60 MG/ML SOSY injection Inject 60 mg into the skin every 6 (six) months.    [provider]    Allergies    Mirtazapine and Simvastatin  Review of Systems   Review of Systems  Constitutional:  Positive for fever.  HENT:  Positive for sore throat.   Respiratory:  Positive for cough, shortness of breath and wheezing.   Cardiovascular:  Negative for chest pain.  Musculoskeletal:  Negative for myalgias.  All other systems reviewed and are negative.  Physical Exam Updated Vital Signs BP (!) 159/89 (BP Location: Right Arm)    Pulse 92    Temp 99.9 F (37.7 C) (Oral)    Resp 20    SpO2 95%   Physical Exam Vitals and nursing note reviewed.  Constitutional:      Appearance: She is well-developed.  HENT:     Head: Normocephalic and atraumatic.     Nose: Nose normal. No congestion or rhinorrhea.     Mouth/Throat:     Mouth: Mucous membranes are moist.     Pharynx: Oropharynx is clear.  Eyes:     Pupils: Pupils are equal, round, and reactive to light.  Cardiovascular:     Rate and Rhythm: Normal rate and regular rhythm.  Pulmonary:     Effort: No respiratory distress.     Breath sounds: No stridor. Rales (LLL) present.  Abdominal:     General: Abdomen is flat. There is no distension.  Musculoskeletal:        General: No swelling or tenderness. Normal  range of motion.     Cervical back: Normal range of motion.  Skin:    General: Skin is warm and dry.  Neurological:     General: No focal deficit present.     Mental Status: She is alert.     Motor: No weakness.    ED Results / Procedures / Treatments   Labs (all labs ordered are listed, but only abnormal results  are displayed) Labs Reviewed  CBC WITH DIFFERENTIAL/PLATELET - Abnormal; Notable for the following components:      Result Value   WBC 12.1 (*)    Neutro Abs 8.8 (*)    Monocytes Absolute 1.4 (*)    All other components within normal limits  COMPREHENSIVE METABOLIC PANEL - Abnormal; Notable for the following components:   Potassium 3.3 (*)    Glucose, Bld 119 (*)    Albumin 3.2 (*)    All other components within normal limits  URINALYSIS, ROUTINE W REFLEX MICROSCOPIC - Abnormal; Notable for the following components:   Color, Urine AMBER (*)    APPearance CLOUDY (*)    Protein, ur 30 (*)    Leukocytes,Ua MODERATE (*)    Bacteria, UA RARE (*)    All other components within normal limits  RESP PANEL BY RT-PCR (FLU A&B, COVID) ARPGX2  URINE CULTURE    EKG None  Radiology DG Chest 2 View  Result Date: 09/13/2021 CLINICAL DATA:  Cough and shortness of breath EXAM: CHEST - 2 VIEW COMPARISON:  09/22/2014 FINDINGS: Heart size and pulmonary vascularity are normal. Emphysematous changes in the lungs. Increased density over the lung bases is likely due to soft tissue attenuation with breast implants present. No airspace disease or consolidation. No pleural effusions. No pneumothorax. Mediastinal contours appear intact. Calcified and tortuous aorta. Degenerative changes in the spine. Thoracic scoliosis convex towards the right. IMPRESSION: Emphysematous changes in the lungs.  No focal consolidation. Electronically Signed   By: Burman Nieves M.D.   On: 09/13/2021 16:15    Procedures Procedures   Medications Ordered in ED Medications  albuterol (VENTOLIN HFA) 108 (90  Base) MCG/ACT inhaler 4 puff (4 puffs Inhalation Given 09/13/21 2108)  benzonatate (TESSALON) capsule 100 mg (100 mg Oral Given 09/14/21 0547)  amoxicillin-clavulanate (AUGMENTIN) 400-57 MG/5ML suspension 875 mg (875 mg Oral Given 09/14/21 6237)    ED Course  I have reviewed the triage vital signs and the nursing notes.  Pertinent labs & imaging results that were available during my care of the patient were reviewed by me and considered in my medical decision making (see chart for details).    MDM Rules/Calculators/A&P                         Suspect pneumonia with asymmetric rales, fever, productive cough x6 days. Even with negative cxr.  Not hypoxic, tachypneic or any kind of significant distress with no indication for admission.  Will start on antibiotics and have follow-up with PCP for reevaluation  Final Clinical Impression(s) / ED Diagnoses Final diagnoses:  Community acquired pneumonia of left lower lobe of lung    Rx / DC Orders ED Discharge Orders          Ordered    amoxicillin-clavulanate (AUGMENTIN) 400-57 MG/5ML suspension  3 times daily        09/14/21 0603    benzonatate (TESSALON) 100 MG capsule  3 times daily PRN        09/14/21 0603             Zachory Mangual, Barbara Cower, MD 09/15/21 0710

## 2021-09-15 LAB — URINE CULTURE

## 2021-09-25 DIAGNOSIS — R052 Subacute cough: Secondary | ICD-10-CM | POA: Diagnosis not present

## 2021-10-18 DIAGNOSIS — Z79899 Other long term (current) drug therapy: Secondary | ICD-10-CM | POA: Diagnosis not present

## 2021-10-18 DIAGNOSIS — I10 Essential (primary) hypertension: Secondary | ICD-10-CM | POA: Diagnosis not present

## 2021-10-18 DIAGNOSIS — R634 Abnormal weight loss: Secondary | ICD-10-CM | POA: Diagnosis not present

## 2021-10-18 DIAGNOSIS — R350 Frequency of micturition: Secondary | ICD-10-CM | POA: Diagnosis not present

## 2021-10-18 DIAGNOSIS — J449 Chronic obstructive pulmonary disease, unspecified: Secondary | ICD-10-CM | POA: Diagnosis not present

## 2021-10-18 DIAGNOSIS — K219 Gastro-esophageal reflux disease without esophagitis: Secondary | ICD-10-CM | POA: Diagnosis not present

## 2021-10-18 DIAGNOSIS — Z0000 Encounter for general adult medical examination without abnormal findings: Secondary | ICD-10-CM | POA: Diagnosis not present

## 2021-11-01 ENCOUNTER — Ambulatory Visit: Payer: Medicare Other | Admitting: Obstetrics and Gynecology

## 2021-11-06 ENCOUNTER — Encounter: Payer: Self-pay | Admitting: Obstetrics & Gynecology

## 2021-11-06 ENCOUNTER — Ambulatory Visit: Payer: Medicare Other | Admitting: Obstetrics & Gynecology

## 2021-11-06 ENCOUNTER — Other Ambulatory Visit: Payer: Self-pay

## 2021-11-06 ENCOUNTER — Telehealth: Payer: Self-pay

## 2021-11-06 VITALS — BP 138/88 | HR 96

## 2021-11-06 DIAGNOSIS — N9089 Other specified noninflammatory disorders of vulva and perineum: Secondary | ICD-10-CM

## 2021-11-06 DIAGNOSIS — K644 Residual hemorrhoidal skin tags: Secondary | ICD-10-CM

## 2021-11-06 MED ORDER — HYDROCORTISONE ACETATE 25 MG RE SUPP: 25.0000 mg | Freq: Two times a day (BID) | RECTAL | 1 refills | Status: AC | PRN

## 2021-11-06 MED ORDER — CLOTRIMAZOLE 1 % EX CREA: 1.0000 "application " | TOPICAL_CREAM | Freq: Two times a day (BID) | CUTANEOUS | 4 refills | Status: DC

## 2021-11-06 NOTE — Telephone Encounter (Signed)
Irritated vulva Received: Today [name], MD  P [facility] Triage  I saw Kimberly Werner this am.  I didn't want to prescribe her an expensive, and not covered, cream for her irritated vulva.  So, I prescribed her Clotrimazole 1%.  Please call her (and her daughter Kimberly Werner) to recommend using it in addition to Hydrocortisone 1% cream over-the-counter.  For the hemorrhoid, I sent Anusol generic 25 mg suppositories as I didn't see anything covered by her insurance.

## 2021-11-06 NOTE — Progress Notes (Signed)
° ° °  Kimberly Werner [birth year] [id number]        88 y.o.  G2P2L2  RP: Irritation at vulva and perianal area  HPI: Irritation at vulva and perianal area.  Ran out of Mycolog.  Careful with cleaning the vulva, now using Dove. Some urge/overflow incontinence.  Wearing a pad during the day and Depends at night.  External hemorrhoid with discomfort.  Treating with Prep H.   OB History  Gravida Para Term Preterm AB Living  2 2       2   SAB IAB Ectopic Multiple Live Births               # Outcome Date GA Lbr Len/2nd Weight Sex Delivery Anes PTL Lv  2 Para           1 Para             Past medical history,surgical history, problem list, medications, allergies, family history and social history were all reviewed and documented in the EPIC chart.   Directed ROS with pertinent positives and negatives documented in the history of present illness/assessment and plan.  Exam:  Vitals:   11/06/21 1011  BP: 138/88  Pulse: 96  SpO2: 96%   General appearance:  Normal   Gynecologic exam: Vulva:  Minor erythema, much improved.  No lesion.  Anal area:  External inflamed hemorrhoid.   Assessment/Plan:  88 y.o. G2P2L2   1. Vulvar irritation Irritation at vulva.  Ran out of Mycolog.  Careful with cleaning the vulva, now using Dove. Some urge/overflow incontinence.  Wearing a pad during the day and Depends at night.  Will treat with Clotrimazole 1% cream twice daily and Hydroxycortisone !% OTC as needed.  Counseling done.  2. Inflamed external hemorrhoid External hemorrhoid with discomfort.  Treating with Prep H.  Very inflamed external hemorrhoid, no current active bleeding.  Will treat with Anusol-HC generic 25 mg suppository twice a day for 7 days.  Prescription sent to pharmacy.  Counseling done.  Other orders - ALPRAZolam (XANAX) 0.5 MG tablet; SMARTSIG:0.25-0.5 Tablet(s) By Mouth Twice Daily PRN - busPIRone (BUSPAR) 5 MG tablet; Take 5 mg by mouth 2 (two) times daily. -  guaiFENesin-codeine (GUAIFENESIN AC) 100-10 MG/5ML syrup; 10 mL as needed - clotrimazole (CLOTRIMAZOLE ANTI-FUNGAL) 1 % cream; Apply 1 application topically 2 (two) times daily. - hydrocortisone (ANUSOL-HC) 25 MG suppository; Place 1 suppository (25 mg total) rectally 2 (two) times daily as needed for up to 7 days for hemorrhoids or anal itching.   Kimberly Werner, 10:33 AM 11/06/2021

## 2021-11-06 NOTE — Telephone Encounter (Signed)
I spoke with daughter Jake Church who answered her Mom's phone and who has DPR access on file.  I relayed all of Dr. Mariah Milling message about the medication recommendations to her.

## 2021-12-18 DIAGNOSIS — N3281 Overactive bladder: Secondary | ICD-10-CM | POA: Diagnosis not present

## 2021-12-18 DIAGNOSIS — I10 Essential (primary) hypertension: Secondary | ICD-10-CM | POA: Diagnosis not present

## 2022-01-02 DIAGNOSIS — M810 Age-related osteoporosis without current pathological fracture: Secondary | ICD-10-CM | POA: Diagnosis not present

## 2022-01-02 DIAGNOSIS — E559 Vitamin D deficiency, unspecified: Secondary | ICD-10-CM | POA: Diagnosis not present

## 2022-02-21 ENCOUNTER — Encounter: Payer: Self-pay | Admitting: Podiatry

## 2022-02-21 ENCOUNTER — Ambulatory Visit: Payer: Medicare Other | Admitting: Podiatry

## 2022-02-21 DIAGNOSIS — L600 Ingrowing nail: Secondary | ICD-10-CM

## 2022-02-21 DIAGNOSIS — B351 Tinea unguium: Secondary | ICD-10-CM | POA: Diagnosis not present

## 2022-02-21 MED ORDER — CEPHALEXIN 500 MG PO CAPS: 500.0000 mg | ORAL_CAPSULE | Freq: Four times a day (QID) | ORAL | 0 refills | Status: AC

## 2022-02-21 NOTE — Progress Notes (Signed)
  Subjective:  Patient ID: Kimberly Werner, female    DOB: 03-Dec-1928,   MRN: [id number]  Chief Complaint  Patient presents with   Toe Pain    Pt came in for Right hallux toe pain which started 3 mths ago, pt has some redness and swelling around the nail, pt denies any drainage, pt rates the pain 8/10    88 y.o. female presents for right great toe swelling and pain. Relates this started three months ago and has been worsening. Relates the nail has been lifting and has been yellow and thick. Has dealt with ingrown's on the other side in the past.  Denies any other pedal complaints. Denies n/v/f/c.   Past Medical History:  Diagnosis Date   Anxiety    COPD (chronic obstructive pulmonary disease) (HCC)    Hyperlipidemia     Objective:  Physical Exam: Vascular: DP/PT pulses 2/4 bilateral. CFT <3 seconds. Normal hair growth on digits. No edema.  Skin. No lacerations or abrasions bilateral feet. Right hallux nail thickened and discolored with surrounding erythema around nail bed. No edema or purulence noted.  Musculoskeletal: MMT 5/5 bilateral lower extremities in DF, PF, Inversion and Eversion. Deceased ROM in DF of ankle joint.  Neurological: Sensation intact to light touch.   Assessment:   1. Ingrown right greater toenail   2. Onychomycosis      Plan:  Patient was evaluated and treated and all questions answered. Discussed ingrown nail and nail dystrophy.  Discussed options for treatments including removal of nail vs trimming back. Discussed trimming back and trying antibiotics.  Nail debrided as courtesy.  Keflex sent to pharmacy.  Discussed use of urea nail gel to keep nail soft and less thick.  Follow-up as needed.   Louann Sjogren, DPM

## 2022-04-05 DIAGNOSIS — K219 Gastro-esophageal reflux disease without esophagitis: Secondary | ICD-10-CM | POA: Diagnosis not present

## 2022-04-05 DIAGNOSIS — M810 Age-related osteoporosis without current pathological fracture: Secondary | ICD-10-CM | POA: Diagnosis not present

## 2022-04-05 DIAGNOSIS — E7800 Pure hypercholesterolemia, unspecified: Secondary | ICD-10-CM | POA: Diagnosis not present

## 2022-04-05 DIAGNOSIS — I10 Essential (primary) hypertension: Secondary | ICD-10-CM | POA: Diagnosis not present

## 2022-05-01 DIAGNOSIS — I10 Essential (primary) hypertension: Secondary | ICD-10-CM | POA: Diagnosis not present

## 2022-05-28 ENCOUNTER — Other Ambulatory Visit: Payer: Self-pay | Admitting: Obstetrics & Gynecology

## 2022-07-03 DIAGNOSIS — E559 Vitamin D deficiency, unspecified: Secondary | ICD-10-CM | POA: Diagnosis not present

## 2022-07-03 DIAGNOSIS — R5383 Other fatigue: Secondary | ICD-10-CM | POA: Diagnosis not present

## 2022-07-03 DIAGNOSIS — M810 Age-related osteoporosis without current pathological fracture: Secondary | ICD-10-CM | POA: Diagnosis not present

## 2022-07-11 DIAGNOSIS — E559 Vitamin D deficiency, unspecified: Secondary | ICD-10-CM | POA: Diagnosis not present

## 2022-07-11 DIAGNOSIS — M810 Age-related osteoporosis without current pathological fracture: Secondary | ICD-10-CM | POA: Diagnosis not present

## 2022-10-25 DIAGNOSIS — H6123 Impacted cerumen, bilateral: Secondary | ICD-10-CM | POA: Diagnosis not present

## 2022-11-13 DIAGNOSIS — R410 Disorientation, unspecified: Secondary | ICD-10-CM | POA: Diagnosis not present

## 2022-11-13 DIAGNOSIS — I10 Essential (primary) hypertension: Secondary | ICD-10-CM | POA: Diagnosis not present

## 2022-11-13 DIAGNOSIS — Z20822 Contact with and (suspected) exposure to covid-19: Secondary | ICD-10-CM | POA: Diagnosis not present

## 2022-11-13 DIAGNOSIS — R059 Cough, unspecified: Secondary | ICD-10-CM | POA: Diagnosis not present

## 2022-11-13 DIAGNOSIS — N390 Urinary tract infection, site not specified: Secondary | ICD-10-CM | POA: Diagnosis not present

## 2022-11-24 ENCOUNTER — Other Ambulatory Visit: Payer: Self-pay | Admitting: Obstetrics & Gynecology

## 2022-12-13 ENCOUNTER — Other Ambulatory Visit: Payer: Self-pay | Admitting: Obstetrics & Gynecology

## 2022-12-13 NOTE — Telephone Encounter (Signed)
Medication refill request: clotrimazole  Last AEX:  last ov 11/06/21 Next AEX: n/a Last MMG (if hormonal medication request): n/a Refill authorized: 30g with 2 rf pended for today

## 2022-12-28 DIAGNOSIS — K219 Gastro-esophageal reflux disease without esophagitis: Secondary | ICD-10-CM | POA: Diagnosis not present

## 2022-12-28 DIAGNOSIS — R634 Abnormal weight loss: Secondary | ICD-10-CM | POA: Diagnosis not present

## 2022-12-28 DIAGNOSIS — F5101 Primary insomnia: Secondary | ICD-10-CM | POA: Diagnosis not present

## 2022-12-28 DIAGNOSIS — I10 Essential (primary) hypertension: Secondary | ICD-10-CM | POA: Diagnosis not present

## 2022-12-28 DIAGNOSIS — R350 Frequency of micturition: Secondary | ICD-10-CM | POA: Diagnosis not present

## 2022-12-28 DIAGNOSIS — Z79899 Other long term (current) drug therapy: Secondary | ICD-10-CM | POA: Diagnosis not present

## 2022-12-28 DIAGNOSIS — Z0000 Encounter for general adult medical examination without abnormal findings: Secondary | ICD-10-CM | POA: Diagnosis not present

## 2022-12-28 DIAGNOSIS — M810 Age-related osteoporosis without current pathological fracture: Secondary | ICD-10-CM | POA: Diagnosis not present

## 2022-12-28 DIAGNOSIS — Z9181 History of falling: Secondary | ICD-10-CM | POA: Diagnosis not present

## 2023-01-11 DIAGNOSIS — M810 Age-related osteoporosis without current pathological fracture: Secondary | ICD-10-CM | POA: Diagnosis not present

## 2023-03-07 DIAGNOSIS — I10 Essential (primary) hypertension: Secondary | ICD-10-CM | POA: Diagnosis not present

## 2023-03-07 DIAGNOSIS — R410 Disorientation, unspecified: Secondary | ICD-10-CM | POA: Diagnosis not present

## 2023-03-07 DIAGNOSIS — N390 Urinary tract infection, site not specified: Secondary | ICD-10-CM | POA: Diagnosis not present

## 2023-03-14 ENCOUNTER — Emergency Department (HOSPITAL_COMMUNITY)
Admission: EM | Admit: 2023-03-14 | Discharge: 2023-03-14 | Disposition: A | Discharge status: Home / Self Care | Payer: Medicare Other | Attending: Emergency Medicine | Admitting: Emergency Medicine

## 2023-03-14 ENCOUNTER — Other Ambulatory Visit: Payer: Self-pay

## 2023-03-14 ENCOUNTER — Emergency Department (HOSPITAL_COMMUNITY): Payer: Medicare Other

## 2023-03-14 DIAGNOSIS — R410 Disorientation, unspecified: Secondary | ICD-10-CM | POA: Diagnosis not present

## 2023-03-14 DIAGNOSIS — R404 Transient alteration of awareness: Secondary | ICD-10-CM | POA: Diagnosis not present

## 2023-03-14 DIAGNOSIS — R4182 Altered mental status, unspecified: Secondary | ICD-10-CM | POA: Diagnosis present

## 2023-03-14 DIAGNOSIS — J439 Emphysema, unspecified: Secondary | ICD-10-CM | POA: Diagnosis not present

## 2023-03-14 DIAGNOSIS — Z743 Need for continuous supervision: Secondary | ICD-10-CM | POA: Diagnosis not present

## 2023-03-14 DIAGNOSIS — F0390 Unspecified dementia without behavioral disturbance: Secondary | ICD-10-CM | POA: Diagnosis not present

## 2023-03-14 DIAGNOSIS — Z7982 Long term (current) use of aspirin: Secondary | ICD-10-CM | POA: Diagnosis not present

## 2023-03-14 DIAGNOSIS — R6889 Other general symptoms and signs: Secondary | ICD-10-CM | POA: Diagnosis not present

## 2023-03-14 DIAGNOSIS — R1310 Dysphagia, unspecified: Secondary | ICD-10-CM | POA: Diagnosis not present

## 2023-03-14 LAB — CBC WITH DIFFERENTIAL/PLATELET
Abs Immature Granulocytes: 0.02 10*3/uL (ref 0.00–0.07)
Basophils Absolute: 0.1 10*3/uL (ref 0.0–0.1)
Basophils Relative: 1 %
Eosinophils Absolute: 0 10*3/uL (ref 0.0–0.5)
Eosinophils Relative: 0 %
HCT: 46.7 % — ABNORMAL HIGH (ref 36.0–46.0)
Hemoglobin: 15.1 g/dL — ABNORMAL HIGH (ref 12.0–15.0)
Immature Granulocytes: 0 %
Lymphocytes Relative: 13 %
Lymphs Abs: 1.2 10*3/uL (ref 0.7–4.0)
MCH: 31.3 pg (ref 26.0–34.0)
MCHC: 32.3 g/dL (ref 30.0–36.0)
MCV: 96.7 fL (ref 80.0–100.0)
Monocytes Absolute: 0.7 10*3/uL (ref 0.1–1.0)
Monocytes Relative: 7 %
Neutro Abs: 7 10*3/uL (ref 1.7–7.7)
Neutrophils Relative %: 79 %
Platelets: 350 10*3/uL (ref 150–400)
RBC: 4.83 MIL/uL (ref 3.87–5.11)
RDW: 12.5 % (ref 11.5–15.5)
WBC: 9 10*3/uL (ref 4.0–10.5)
nRBC: 0 % (ref 0.0–0.2)

## 2023-03-14 LAB — URINALYSIS, W/ REFLEX TO CULTURE (INFECTION SUSPECTED)
Bacteria, UA: NONE SEEN
Bilirubin Urine: NEGATIVE
Glucose, UA: NEGATIVE mg/dL
Hgb urine dipstick: NEGATIVE
Ketones, ur: NEGATIVE mg/dL
Leukocytes,Ua: NEGATIVE
Nitrite: NEGATIVE
Protein, ur: NEGATIVE mg/dL
Specific Gravity, Urine: 1.002 — ABNORMAL LOW (ref 1.005–1.030)
pH: 8 (ref 5.0–8.0)

## 2023-03-14 LAB — COMPREHENSIVE METABOLIC PANEL
ALT: 19 U/L (ref 0–44)
AST: 23 U/L (ref 15–41)
Albumin: 3.9 g/dL (ref 3.5–5.0)
Alkaline Phosphatase: 40 U/L (ref 38–126)
Anion gap: 8 (ref 5–15)
BUN: <5 mg/dL — ABNORMAL LOW (ref 8–23)
CO2: 26 mmol/L (ref 22–32)
Calcium: 9.3 mg/dL (ref 8.9–10.3)
Chloride: 102 mmol/L (ref 98–111)
Creatinine, Ser: 0.65 mg/dL (ref 0.44–1.00)
GFR, Estimated: >60 mL/min (ref >60)
Glucose, Bld: 113 mg/dL — ABNORMAL HIGH (ref 70–99)
Potassium: 3.9 mmol/L (ref 3.5–5.1)
Sodium: 136 mmol/L (ref 135–145)
Total Bilirubin: 1.1 mg/dL (ref 0.3–1.2)
Total Protein: 6.8 g/dL (ref 6.5–8.1)

## 2023-03-14 MED ORDER — LORAZEPAM 2 MG/ML IJ SOLN
1.0000 mg | Freq: Once | INTRAMUSCULAR | Status: AC
Start: 2023-03-14 — End: 2023-03-14
  Administered 2023-03-14: 1 mg via INTRAVENOUS
  Filled 2023-03-14: qty 1

## 2023-03-14 NOTE — ED Provider Notes (Signed)
[facility] EMERGENCY DEPARTMENT AT [facility] Provider Note   CSN: [id number] Arrival date & time: 03/14/23  1247     History  Chief Complaint  Patient presents with   UTI   Dysphagia   Altered Mental Status    Kimberly Werner is a 89 y.o. female.  HPI Patient presents initially alone, but eventually is joined by her daughter and her son-in-law.  Patient presents with concern for agitation/change from baseline.  She has a history of dementia, but has been doing generally well until a few weeks ago.  Since that time she has become less participatory, agreeable to taking medication, eating and drinking.  Patient herself has dementia, level 5 caveat. When asked how she is doing, patient perseverates on not being able to eat or drink due to throat discomfort. Patient alternatingly complains of throat discomfort and requests a single drink and she does so without apparent complication. Family notes that as above the patient has been increasingly less consistent with prior baseline of interactivity, and was diagnosed with a urinary tract infection 1 week ago, culture came back yesterday and new antibiotic regimen is scheduled to begin today but they have not yet picked up those medications. Patient actually arrives via EMS and per EMS the patient has been interactive throughout transport describing discomfort with swallowing.  EMS reports no hemodynamic instability en route.    Home Medications Prior to Admission medications   Medication Sig Start Date End Date Taking? Authorizing Provider  acetaminophen (TYLENOL) 500 MG tablet Take 500 mg by mouth every 6 (six) hours as needed for mild pain.    [provider]  ALPRAZolam Prudy Feeler) 0.5 MG tablet SMARTSIG:0.25-0.5 Tablet(s) By Mouth Twice Daily PRN 09/14/21   [provider]  amLODipine (NORVASC) 2.5 MG tablet Take 2.5 mg by mouth at bedtime.    [provider]  aspirin 325 MG tablet Take 325 mg by mouth  daily.    [provider]  benzonatate (TESSALON) 100 MG capsule Take 1 capsule (100 mg total) by mouth 3 (three) times daily as needed for cough. 09/14/21   Mesner, Barbara Cower, MD  busPIRone (BUSPAR) 5 MG tablet Take 5 mg by mouth 2 (two) times daily. 10/18/21   [provider]  Calcium Carbonate-Vitamin D (CALCIUM 500 + D PO) Take 1 tablet by mouth daily.    [provider]  cholecalciferol (VITAMIN D3) 25 MCG (1000 UT) tablet Take 1,000 Units by mouth daily.    [provider]  clotrimazole (LOTRIMIN) 1 % cream APPLY TO AFFECTED AREA TWICE A DAY 12/13/22   Genia Del, MD  denosumab (PROLIA) 60 MG/ML SOSY injection Inject 60 mg into the skin every 6 (six) months.    [provider]  DULoxetine (CYMBALTA) 60 MG capsule Take 60 mg by mouth daily.    [provider]  famotidine (PEPCID) 20 MG tablet 1 tablet    [provider]  guaiFENesin (MUCINEX) 600 MG 12 hr tablet Take 600 mg by mouth daily as needed for cough.    [provider]  guaiFENesin-codeine (GUAIFENESIN AC) 100-10 MG/5ML syrup 10 mL as needed 09/25/21   [provider]  ibuprofen (ADVIL,MOTRIN) 200 MG tablet Take 200 mg by mouth every 6 (six) hours as needed (pain).    [provider]  loratadine (CLARITIN) 10 MG tablet Take 10 mg by mouth daily.    [provider]  Multiple Vitamin (MULTIVITAMIN WITH MINERALS) TABS Take 1 tablet by mouth daily.  [provider]  nystatin ointment (MYCOSTATIN) Apply 1 application topically at bedtime. Use as needed for mild vulvar irritation 07/21/21   Genia Del, MD  omeprazole (PRILOSEC) 20 MG capsule 1 capsule 30 minutes before morning meal    [provider]  triamcinolone ointment (KENALOG) 0.5 % Apply 1 application topically at bedtime. Use as needed for mild vulvar irritation 07/21/21   Genia Del, MD      Allergies    Mirtazapine and Simvastatin    Review of  Systems   Review of Systems  Unable to perform ROS: Dementia    Physical Exam Updated Vital Signs BP (!) 150/78   Pulse 69   Resp 20   SpO2 96%  Physical Exam Vitals and nursing note reviewed.  Constitutional:      General: She is not in acute distress.    Appearance: She is well-developed. She is not ill-appearing, toxic-appearing or diaphoretic.  HENT:     Head: Normocephalic and atraumatic.  Eyes:     Conjunctiva/sclera: Conjunctivae normal.  Cardiovascular:     Rate and Rhythm: Normal rate and regular rhythm.  Pulmonary:     Effort: Pulmonary effort is normal. No respiratory distress.     Breath sounds: Normal breath sounds. No stridor.  Abdominal:     General: There is no distension.  Skin:    General: Skin is warm and dry.  Neurological:     Mental Status: She is alert.     Cranial Nerves: No cranial nerve deficit.     Motor: Atrophy present. No tremor or abnormal muscle tone.     Comments: Patient sitting upright, attempting to crawl from the bed repeatedly, requiring verbal reassurance and assistance to return to the bed.  Psychiatric:        Mood and Affect: Mood is anxious.        Cognition and Memory: Cognition is impaired. Memory is impaired.     ED Results / Procedures / Treatments   Labs (all labs ordered are listed, but only abnormal results are displayed) Labs Reviewed  COMPREHENSIVE METABOLIC PANEL - Abnormal; Notable for the following components:      Result Value   Glucose, Bld 113 (*)    BUN <5 (*)    All other components within normal limits  CBC WITH DIFFERENTIAL/PLATELET - Abnormal; Notable for the following components:   Hemoglobin 15.1 (*)    HCT 46.7 (*)    All other components within normal limits  URINALYSIS, W/ REFLEX TO CULTURE (INFECTION SUSPECTED) - Abnormal; Notable for the following components:   Color, Urine STRAW (*)    Specific Gravity, Urine 1.002 (*)    All other components within normal limits     EKG None  Radiology DG Chest Port 1 View  Result Date: 03/14/2023 CLINICAL DATA:  Altered level of consciousness, aspiration EXAM: PORTABLE CHEST 1 VIEW COMPARISON:  09/13/2021 FINDINGS: Single frontal view of the chest was obtained. Evaluation is limited by patient kyphosis and overlying structures obscuring the lung apices. Cardiac silhouette is unremarkable. There is background emphysema without airspace disease, effusion, or pneumothorax. No acute bony abnormalities. IMPRESSION: 1. Stable emphysema.  No acute process. Electronically Signed   By: Sharlet Salina M.D.   On: 03/14/2023 15:35    Procedures Procedures    Medications Ordered in ED Medications  LORazepam (ATIVAN) injection 1 mg (1 mg Intravenous Given 03/14/23 1311)    ED Course/ Medical Decision Making/ A&P  Medical Decision Making Elderly female with dementia, previously diagnosed dysphagia, now presents with change from baseline including irritability, less compliance with taking her medication, and perseverates on not being able to eat or drink.  Patient is awake and alert, drinking water without any obvious complication, is hemodynamically unremarkable, has no initial evidence for infection, bacteremia, sepsis.  Given her history, differential including infection such as UTI versus electrolyte abnormalities or dehydration are considered.  No evidence for trauma, and with otherwise unremarkable neuroexam little evidence for intracranial new pathology. Evaluation here performed, discussed with family members including labs, UA, and she was monitored for hours without hemodynamic instability. Patient did require 1 dose of Ativan to facilitate evaluation, and as the patient is already on Xanax, this seemed reasonable. After lengthy conversation with the patient's family members, patient be discharged to follow-up with her primary care physician next week.  Amount and/or Complexity of Data  Reviewed Labs: ordered. Decision-making details documented in ED Course. Radiology: ordered and independent interpretation performed. Decision-making details documented in ED Course.    Details: No evidence for aspiration pneumonia.  Risk Prescription drug management. Decision regarding hospitalization. Diagnosis or treatment significantly limited by social determinants of health.  Final Clinical Impression(s) / ED Diagnoses Final diagnoses:  Transient alteration of awareness    Rx / DC Orders ED Discharge Orders     None         Gerhard Munch, MD 03/14/23 1606

## 2023-03-14 NOTE — ED Notes (Signed)
EDP at [facility]. Triage in process. Pt alert, anxious, verbalizes repetitively thirst and needing to go to b/r. Frustrated and angry. Voided prior to leaving facility. Resps e/u, speaking clearly. No obvious swallowing difficulty. Airway patent. Anxious, agitated, preoccupied with thirst and water. Given water x2 w/o appeasement, change in behavior or quenching thirst. Appears behavioral and not physical, will continue to monitor.

## 2023-03-14 NOTE — ED Triage Notes (Addendum)
Patient BIB EMS from home with c/o decrease intake for a few day, UTI and has completed ABT for UTI. Since UTI patient mental status has worsen along with intake. Patient c/o dysphagia. Denies painful urination. Patient urinated before leaving home. H/O Dementia. 132/80, 78, 95%RA, 20g LAC IVF. CBG 113

## 2023-03-14 NOTE — ED Notes (Signed)
Family x2 arrives to [facility]. EDP notified. Daughter verbalizes pt's recent preoccupation of last 2 weeks has been thirst, and difficulty swallowing.

## 2023-03-14 NOTE — ED Notes (Addendum)
Xray at [facility] for portable chest. Family x2 present. Pt sleeping.

## 2023-03-14 NOTE — Discharge Instructions (Signed)
As discussed, today's evaluation has been generally reassuring.  If you develop new, or concerning changes do not hesitate to return here.  Otherwise follow-up with your physician next week.  In particular, when you see your physician discuss your medication regimen and any substitutions or removals from the list that may be reasonable.

## 2023-03-14 NOTE — ED Notes (Signed)
EDP at [facility] speaking with family 

## 2023-03-18 DIAGNOSIS — R1319 Other dysphagia: Secondary | ICD-10-CM | POA: Diagnosis not present

## 2023-03-18 DIAGNOSIS — J449 Chronic obstructive pulmonary disease, unspecified: Secondary | ICD-10-CM | POA: Diagnosis not present

## 2023-03-19 ENCOUNTER — Other Ambulatory Visit: Payer: Self-pay | Admitting: Internal Medicine

## 2023-03-19 DIAGNOSIS — R1310 Dysphagia, unspecified: Secondary | ICD-10-CM

## 2023-03-29 ENCOUNTER — Ambulatory Visit
Admission: RE | Admit: 2023-03-29 | Discharge: 2023-03-29 | Disposition: A | Discharge status: Home / Self Care | Payer: Medicare Other | Source: Ambulatory Visit | Attending: Internal Medicine | Admitting: Internal Medicine

## 2023-03-29 DIAGNOSIS — R1310 Dysphagia, unspecified: Secondary | ICD-10-CM

## 2023-04-03 DIAGNOSIS — R1319 Other dysphagia: Secondary | ICD-10-CM | POA: Diagnosis not present

## 2023-04-03 DIAGNOSIS — R451 Restlessness and agitation: Secondary | ICD-10-CM | POA: Diagnosis not present

## 2023-05-06 DIAGNOSIS — R451 Restlessness and agitation: Secondary | ICD-10-CM | POA: Diagnosis not present

## 2023-05-06 DIAGNOSIS — R600 Localized edema: Secondary | ICD-10-CM | POA: Diagnosis not present

## 2023-05-08 DIAGNOSIS — R7989 Other specified abnormal findings of blood chemistry: Secondary | ICD-10-CM | POA: Diagnosis not present

## 2023-05-08 DIAGNOSIS — R791 Abnormal coagulation profile: Secondary | ICD-10-CM | POA: Diagnosis not present

## 2023-05-08 DIAGNOSIS — R600 Localized edema: Secondary | ICD-10-CM | POA: Diagnosis not present

## 2023-05-31 DIAGNOSIS — R600 Localized edema: Secondary | ICD-10-CM | POA: Diagnosis not present

## 2023-06-21 ENCOUNTER — Other Ambulatory Visit (HOSPITAL_COMMUNITY): Payer: Self-pay | Admitting: Internal Medicine

## 2023-06-21 DIAGNOSIS — R0602 Shortness of breath: Secondary | ICD-10-CM

## 2023-06-25 DIAGNOSIS — Z79899 Other long term (current) drug therapy: Secondary | ICD-10-CM | POA: Diagnosis not present

## 2023-07-11 ENCOUNTER — Ambulatory Visit (HOSPITAL_COMMUNITY): Payer: Medicare Other | Attending: Cardiology

## 2023-07-11 DIAGNOSIS — R0602 Shortness of breath: Secondary | ICD-10-CM | POA: Diagnosis not present

## 2023-07-11 LAB — ECHOCARDIOGRAM COMPLETE
Area-P 1/2: 3.6 cm2
S' Lateral: 2.1 cm

## 2023-07-15 DIAGNOSIS — M810 Age-related osteoporosis without current pathological fracture: Secondary | ICD-10-CM | POA: Diagnosis not present

## 2023-08-06 DIAGNOSIS — I10 Essential (primary) hypertension: Secondary | ICD-10-CM | POA: Diagnosis not present

## 2023-08-06 DIAGNOSIS — L97529 Non-pressure chronic ulcer of other part of left foot with unspecified severity: Secondary | ICD-10-CM | POA: Diagnosis not present

## 2023-08-09 DIAGNOSIS — L0390 Cellulitis, unspecified: Secondary | ICD-10-CM | POA: Diagnosis not present

## 2023-08-09 DIAGNOSIS — I10 Essential (primary) hypertension: Secondary | ICD-10-CM | POA: Diagnosis not present

## 2023-10-23 DIAGNOSIS — L89892 Pressure ulcer of other site, stage 2: Secondary | ICD-10-CM | POA: Diagnosis not present

## 2023-10-23 DIAGNOSIS — L299 Pruritus, unspecified: Secondary | ICD-10-CM | POA: Diagnosis not present

## 2023-10-23 DIAGNOSIS — R600 Localized edema: Secondary | ICD-10-CM | POA: Diagnosis not present

## 2024-01-01 DIAGNOSIS — I10 Essential (primary) hypertension: Secondary | ICD-10-CM | POA: Diagnosis not present

## 2024-01-01 DIAGNOSIS — L89892 Pressure ulcer of other site, stage 2: Secondary | ICD-10-CM | POA: Diagnosis not present

## 2024-01-01 DIAGNOSIS — H60501 Unspecified acute noninfective otitis externa, right ear: Secondary | ICD-10-CM | POA: Diagnosis not present

## 2024-01-01 DIAGNOSIS — Z0000 Encounter for general adult medical examination without abnormal findings: Secondary | ICD-10-CM | POA: Diagnosis not present

## 2024-01-01 DIAGNOSIS — L299 Pruritus, unspecified: Secondary | ICD-10-CM | POA: Diagnosis not present

## 2024-01-01 DIAGNOSIS — R451 Restlessness and agitation: Secondary | ICD-10-CM | POA: Diagnosis not present

## 2024-01-01 DIAGNOSIS — M810 Age-related osteoporosis without current pathological fracture: Secondary | ICD-10-CM | POA: Diagnosis not present

## 2024-01-01 DIAGNOSIS — K219 Gastro-esophageal reflux disease without esophagitis: Secondary | ICD-10-CM | POA: Diagnosis not present

## 2024-01-01 DIAGNOSIS — R600 Localized edema: Secondary | ICD-10-CM | POA: Diagnosis not present

## 2024-01-01 DIAGNOSIS — Z79899 Other long term (current) drug therapy: Secondary | ICD-10-CM | POA: Diagnosis not present

## 2024-01-15 DIAGNOSIS — M810 Age-related osteoporosis without current pathological fracture: Secondary | ICD-10-CM | POA: Diagnosis not present

## 2024-01-22 DIAGNOSIS — H6123 Impacted cerumen, bilateral: Secondary | ICD-10-CM | POA: Diagnosis not present

## 2024-02-17 ENCOUNTER — Ambulatory Visit: Admitting: Podiatry

## 2024-02-17 ENCOUNTER — Encounter: Payer: Self-pay | Admitting: Podiatry

## 2024-02-17 DIAGNOSIS — B351 Tinea unguium: Secondary | ICD-10-CM | POA: Diagnosis not present

## 2024-02-17 DIAGNOSIS — L600 Ingrowing nail: Secondary | ICD-10-CM | POA: Diagnosis not present

## 2024-02-17 NOTE — Progress Notes (Signed)
  Subjective:  Patient ID: Kimberly Werner, female    DOB: 1929/08/15,   MRN: [id number]  Chief Complaint  Patient presents with   Nail Problem    Rm24/ right nail hallux nail thick and painful/not diabetic    89 y.o. female presents for right great toe pain. Relates the nail has started to hurt again in the last few weeks. Currently today doing well and no pain. She has been concerned for repeat infection and ingrown toenail  Denies any other pedal complaints. Denies n/v/f/c.   Past Medical History:  Diagnosis Date   Anxiety    COPD (chronic obstructive pulmonary disease) (HCC)    Hyperlipidemia     Objective:  Physical Exam: Vascular: DP/PT pulses 2/4 bilateral. CFT <3 seconds. Normal hair growth on digits. No edema.  Skin. No lacerations or abrasions bilateral feet. Right hallux nail thickened and discolored with no surrounding erythema around nail bed. No edema or purulence noted.  Musculoskeletal: MMT 5/5 bilateral lower extremities in DF, PF, Inversion and Eversion. Deceased ROM in DF of ankle joint.  Neurological: Sensation intact to light touch.   Assessment:   1. Ingrown right greater toenail   2. Onychomycosis       Plan:  Patient was evaluated and treated and all questions answered. Discussed ingrown nail and nail dystrophy.  Discussed options for treatments including removal of nail vs trimming back. Discussed trimming back and trying antibiotics.  Nail debrided as courtesy.  No abx necessary today.  Discussed use of urea nail gel to keep nail soft and less thick.  Follow-up in 6 months for retrimming.   Jennefer Moats, DPM

## 2024-05-01 DIAGNOSIS — R451 Restlessness and agitation: Secondary | ICD-10-CM | POA: Diagnosis not present

## 2024-05-01 DIAGNOSIS — I10 Essential (primary) hypertension: Secondary | ICD-10-CM | POA: Diagnosis not present

## 2024-05-01 DIAGNOSIS — K219 Gastro-esophageal reflux disease without esophagitis: Secondary | ICD-10-CM | POA: Diagnosis not present

## 2024-05-01 DIAGNOSIS — M25559 Pain in unspecified hip: Secondary | ICD-10-CM | POA: Diagnosis not present

## 2024-08-17 ENCOUNTER — Ambulatory Visit: Admitting: Podiatry

## 2024-09-02 ENCOUNTER — Encounter: Payer: Self-pay | Admitting: Podiatry

## 2024-09-02 ENCOUNTER — Ambulatory Visit: Admitting: Podiatry

## 2024-09-02 DIAGNOSIS — L600 Ingrowing nail: Secondary | ICD-10-CM

## 2024-09-02 DIAGNOSIS — B351 Tinea unguium: Secondary | ICD-10-CM

## 2024-09-02 NOTE — Progress Notes (Signed)
°  Subjective:  Patient ID: Kimberly Werner, female    DOB: December 19, 1928,   MRN: [id number]  No chief complaint on file.   89 y.o. female presents for right great toe pain. Relates the nail has started to hurt again in the last few weeks. Currently today doing well and no pain. She has been concerned for repeat infection and ingrown toenail  Denies any other pedal complaints. Denies n/v/f/c.   Past Medical History:  Diagnosis Date   Anxiety    COPD (chronic obstructive pulmonary disease) (HCC)    Hyperlipidemia     Objective:  Physical Exam: Vascular: DP/PT pulses 2/4 bilateral. CFT <3 seconds. Normal hair growth on digits. No edema.  Skin. No lacerations or abrasions bilateral feet. Right hallux nail thickened and discolored with no surrounding erythema around nail bed. No edema or purulence noted.  Musculoskeletal: MMT 5/5 bilateral lower extremities in DF, PF, Inversion and Eversion. Deceased ROM in DF of ankle joint.  Neurological: Sensation intact to light touch.   Assessment:   1. Onychomycosis       Plan:  Patient was evaluated and treated and all questions answered. Discussed ingrown nail and nail dystrophy.  Discussed options for treatments including removal of nail vs trimming back. Discussed trimming back and trying antibiotics.  -Hyperkeratotic lesions debrided as courtesy today and discussed prevention techniques.  Nail debrided as courtesy.  No abx necessary today.  Discussed use of urea nail gel to keep nail soft and less thick.  Follow-up in 6 months for retrimming.   Asberry Failing, DPM

## 2025-03-03 ENCOUNTER — Ambulatory Visit: Admitting: Podiatry
